# Patient Record
Sex: Male | Born: 1990
Health system: Southern US, Community
[De-identification: ages and names within clinical notes are randomized; demographics above are authoritative.]

## PROBLEM LIST (undated history)

## (undated) DIAGNOSIS — G809 Cerebral palsy, unspecified: Secondary | ICD-10-CM

## (undated) DIAGNOSIS — G800 Spastic quadriplegic cerebral palsy: Secondary | ICD-10-CM

## (undated) DIAGNOSIS — Z8619 Personal history of other infectious and parasitic diseases: Secondary | ICD-10-CM

## (undated) HISTORY — PX: OTHER SURGICAL HISTORY: SHX169

## (undated) HISTORY — DX: Spastic quadriplegic cerebral palsy: G80.0

## (undated) HISTORY — DX: Personal history of other infectious and parasitic diseases: Z86.19

## (undated) HISTORY — DX: Cerebral palsy, unspecified: G80.9

---
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
## 2010-05-12 ENCOUNTER — Encounter: Payer: Self-pay | Admitting: Pediatrics

## 2010-05-14 ENCOUNTER — Encounter: Payer: Self-pay | Admitting: Pediatrics

## 2011-09-15 DIAGNOSIS — Z131 Encounter for screening for diabetes mellitus: Secondary | ICD-10-CM | POA: Diagnosis not present

## 2011-09-15 DIAGNOSIS — Z13220 Encounter for screening for lipoid disorders: Secondary | ICD-10-CM | POA: Diagnosis not present

## 2011-09-15 DIAGNOSIS — G809 Cerebral palsy, unspecified: Secondary | ICD-10-CM | POA: Diagnosis not present

## 2011-10-18 DIAGNOSIS — Z131 Encounter for screening for diabetes mellitus: Secondary | ICD-10-CM | POA: Diagnosis not present

## 2011-10-18 DIAGNOSIS — Z13220 Encounter for screening for lipoid disorders: Secondary | ICD-10-CM | POA: Diagnosis not present

## 2012-07-08 DIAGNOSIS — G808 Other cerebral palsy: Secondary | ICD-10-CM | POA: Diagnosis not present

## 2012-07-08 DIAGNOSIS — M542 Cervicalgia: Secondary | ICD-10-CM | POA: Diagnosis not present

## 2012-08-29 DIAGNOSIS — G808 Other cerebral palsy: Secondary | ICD-10-CM | POA: Diagnosis not present

## 2012-10-02 DIAGNOSIS — G809 Cerebral palsy, unspecified: Secondary | ICD-10-CM | POA: Diagnosis not present

## 2012-10-02 DIAGNOSIS — Z131 Encounter for screening for diabetes mellitus: Secondary | ICD-10-CM | POA: Diagnosis not present

## 2012-10-02 DIAGNOSIS — R112 Nausea with vomiting, unspecified: Secondary | ICD-10-CM | POA: Diagnosis not present

## 2013-11-27 DIAGNOSIS — F70 Mild intellectual disabilities: Secondary | ICD-10-CM | POA: Diagnosis not present

## 2013-11-28 DIAGNOSIS — R112 Nausea with vomiting, unspecified: Secondary | ICD-10-CM | POA: Diagnosis not present

## 2013-11-28 DIAGNOSIS — Z131 Encounter for screening for diabetes mellitus: Secondary | ICD-10-CM | POA: Diagnosis not present

## 2013-11-28 DIAGNOSIS — Z0000 Encounter for general adult medical examination without abnormal findings: Secondary | ICD-10-CM | POA: Diagnosis not present

## 2013-12-10 ENCOUNTER — Encounter: Payer: Self-pay | Admitting: Family Medicine

## 2013-12-10 DIAGNOSIS — G8250 Quadriplegia, unspecified: Secondary | ICD-10-CM | POA: Diagnosis not present

## 2013-12-10 DIAGNOSIS — G809 Cerebral palsy, unspecified: Secondary | ICD-10-CM | POA: Diagnosis not present

## 2013-12-12 ENCOUNTER — Encounter: Payer: Self-pay | Admitting: Family Medicine

## 2014-02-03 DIAGNOSIS — G8250 Quadriplegia, unspecified: Secondary | ICD-10-CM | POA: Diagnosis not present

## 2014-02-03 DIAGNOSIS — G808 Other cerebral palsy: Secondary | ICD-10-CM | POA: Diagnosis not present

## 2014-02-03 DIAGNOSIS — L0501 Pilonidal cyst with abscess: Secondary | ICD-10-CM | POA: Diagnosis not present

## 2014-03-25 DIAGNOSIS — H6690 Otitis media, unspecified, unspecified ear: Secondary | ICD-10-CM | POA: Diagnosis not present

## 2014-03-25 DIAGNOSIS — G8250 Quadriplegia, unspecified: Secondary | ICD-10-CM | POA: Diagnosis not present

## 2014-03-25 DIAGNOSIS — L0501 Pilonidal cyst with abscess: Secondary | ICD-10-CM | POA: Diagnosis not present

## 2016-03-10 ENCOUNTER — Telehealth: Payer: Self-pay

## 2016-03-10 NOTE — Telephone Encounter (Signed)
Patient mom Levada Dy called yesterday 03/09/2016 requesting a handicap place card be completed for patient. Levada Dy states their old place card expires the end of this month (02/2016). Place card application completed by Fenton Malling PA, and picked up by patient step Dad on 03/10/2016. I called patient mom on the phone while patient step dad was here, and verbally confirmed that it was ok to release form to him.

## 2016-07-24 DIAGNOSIS — H547 Unspecified visual loss: Secondary | ICD-10-CM | POA: Insufficient documentation

## 2016-07-24 DIAGNOSIS — G800 Spastic quadriplegic cerebral palsy: Secondary | ICD-10-CM | POA: Insufficient documentation

## 2016-07-24 DIAGNOSIS — G809 Cerebral palsy, unspecified: Secondary | ICD-10-CM | POA: Insufficient documentation

## 2016-07-24 DIAGNOSIS — Z7409 Other reduced mobility: Secondary | ICD-10-CM | POA: Insufficient documentation

## 2016-07-24 DIAGNOSIS — G8250 Quadriplegia, unspecified: Secondary | ICD-10-CM | POA: Insufficient documentation

## 2016-07-25 ENCOUNTER — Ambulatory Visit (INDEPENDENT_AMBULATORY_CARE_PROVIDER_SITE_OTHER): Payer: Medicaid Other | Admitting: Family Medicine

## 2016-07-25 ENCOUNTER — Encounter: Payer: Self-pay | Admitting: Family Medicine

## 2016-07-25 VITALS — BP 122/76 | HR 124 | Temp 97.5°F | Resp 16

## 2016-07-25 DIAGNOSIS — R000 Tachycardia, unspecified: Secondary | ICD-10-CM | POA: Diagnosis not present

## 2016-07-25 DIAGNOSIS — Z13220 Encounter for screening for lipoid disorders: Secondary | ICD-10-CM

## 2016-07-25 DIAGNOSIS — Z0000 Encounter for general adult medical examination without abnormal findings: Secondary | ICD-10-CM | POA: Diagnosis not present

## 2016-07-25 DIAGNOSIS — Z136 Encounter for screening for cardiovascular disorders: Secondary | ICD-10-CM | POA: Diagnosis not present

## 2016-07-25 DIAGNOSIS — G800 Spastic quadriplegic cerebral palsy: Secondary | ICD-10-CM | POA: Diagnosis not present

## 2016-07-25 DIAGNOSIS — G809 Cerebral palsy, unspecified: Secondary | ICD-10-CM

## 2016-07-25 NOTE — Progress Notes (Signed)
Patient: Gregory Villanueva, Male    DOB: 1991/02/19, 25 y.o.   MRN: [id number] Visit Date: 07/25/2016  Today's Provider: Lelon Huh, MD   Chief Complaint  Patient presents with  . Annual Exam  . Cerebral Palsy    follow up   Subjective:    Annual physical exam Gregory Villanueva is a 25 y.o. male who presents today for health maintenance and complete physical. He feels fairly well. He reports exercising regularly, getting physical therapy every other week. He reports he is sleeping fairly well.  ----------------------------------------------------------------- Follow up Cerebral Palsy:  Patient was last seen over 2 years ago and no changes were made.   Follow up Spastic Quadriplegia:  Patient was last seen for this problem over 2 years ago and no changes were made.  Review of Systems  Constitutional: Negative for appetite change, chills, fatigue and fever.  HENT: Negative for congestion, ear pain, hearing loss, nosebleeds and trouble swallowing.   Eyes: Negative for pain and visual disturbance.  Respiratory: Negative for cough, chest tightness and shortness of breath.   Cardiovascular: Negative for chest pain, palpitations and leg swelling.  Gastrointestinal: Negative for abdominal pain, blood in stool, constipation, diarrhea, nausea and vomiting.  Endocrine: Negative for polydipsia, polyphagia and polyuria.  Genitourinary: Negative for dysuria and flank pain.  Musculoskeletal: Negative for arthralgias, back pain, joint swelling, myalgias and neck stiffness.  Skin: Negative for color change, rash and wound.  Neurological: Negative for dizziness, tremors, seizures, speech difficulty, weakness, light-headedness and headaches.  Psychiatric/Behavioral: Negative for behavioral problems, confusion, decreased concentration, dysphoric mood and sleep disturbance. The patient is not nervous/anxious.   All other systems reviewed and are negative.   Social History      He  reports  that he has never smoked. He does not have any smokeless tobacco history on file. He reports that he does not drink alcohol or use drugs.       Social History   Social History  . Marital status: Single    Spouse name: N/A  . Number of children: N/A  . Years of education: N/A   Social History Main Topics  . Smoking status: Never Smoker  . Smokeless tobacco: Not on file  . Alcohol use No  . Drug use: No  . Sexual activity: Not on file   Other Topics Concern  . Not on file   Social History Narrative  . No narrative on file    Past Medical History:  Diagnosis Date  . Cerebral palsy ([place])   . CP (cerebral palsy), spastic, quadriplegic ([place])   . History of chickenpox      Patient Active Problem List   Diagnosis Date Noted  . Cerebral palsy ([place]) 07/24/2016  . Spastic quadriplegic cerebral palsy ([place]) 07/24/2016  . Impaired mobility 07/24/2016  . Spastic quadriplegia ([place]) 07/24/2016  . Impaired vision 07/24/2016    Past Surgical History:  Procedure Laterality Date  . Dorsal Rhinoplasty  1998  . Various muscle and Tendon Surgeries     1992-2000    Family History        Family Status  Relation Status  . Mother Alive  . Father Deceased at age 77   cause of death: Pancreatic cancer  . Sister Alive  . Brother Alive  . Maternal Grandmother Alive  . Maternal Grandfather Alive  . Sister Alive  . Sister Alive        His family history includes Hypertension in his maternal  grandfather and maternal grandmother; Pancreatic cancer in his father.     Allergies no known allergies  No current outpatient prescriptions on file.   Patient Care Team: Birdie Sons, MD as PCP - General (Family Medicine)      Objective:   Vitals: BP 122/76 (BP Location: Left Arm, Patient Position: Sitting, Cuff Size: Normal)   Pulse (!) 124   Temp 97.5 F (36.4 C) (Oral)   Resp 16   SpO2 97% Comment: room air   Physical Exam   General Appearance:    Alert, cooperative, no  distress, appears stated age  Head:    Normocephalic, without obvious abnormality, atraumatic  Eyes:    PERRL, conjunctiva/corneas clear, EOM's intact, fundi    benign, both eyes       Ears:    Normal TM's and external ear canals, both ears  Nose:   Nares normal, septum midline, mucosa normal, no drainage   or sinus tenderness  Throat:   Lips, mucosa, and tongue normal; teeth and gums normal  Neck:   Supple, symmetrical, trachea midline, no adenopathy;       thyroid:  No enlargement/tenderness/nodules; no carotid   bruit or JVD  Back:     Symmetric, no curvature, ROM normal, no CVA tenderness  Lungs:     Clear to auscultation bilaterally, respirations unlabored  Chest wall:    No tenderness or deformity  Heart:    Tachycardic, regular rhythm, S1 and S2 normal, no murmur, rub   or gallop  Abdomen:     Soft, non-tender, bowel sounds active all four quadrants,    no masses, no organomegaly  Genitalia:    deferred  Rectal:    deferred  Extremities:   Extremities atraumatic, no cyanosis or edema  Pulses:   2+ and symmetric all extremities  Skin:   Skin color, texture, turgor normal, no rashes or lesions  Lymph nodes:   Cervical, supraclavicular, and axillary nodes normal  Neurologic:   CNII-XII intact. Note : awake and alert, oriented x 3. communicates fairly well. generally hypertonic musculature of all extremities     Depression Screen PHQ 2/9 Scores 07/25/2016  PHQ - 2 Score 0  PHQ- 9 Score 0      Assessment & Plan:     Routine Health Maintenance and Physical Exam  Exercise Activities and Dietary recommendations Goals    None      Immunization History  Administered Date(s) Administered  . Tdap 09/15/2011    Health Maintenance  Topic Date Due  . HIV Screening  10/24/2004  . INFLUENZA VACCINE  03/14/2016  . TETANUS/TDAP  09/14/2021     Discussed health benefits of physical activity, and encouraged him to engage in regular exercise appropriate for his age and  condition.    --------------------------------------------------------------------  1. Annual physical exam  - Lipid panel  2. Tachycardia  - EKG 12-Lead - Comprehensive metabolic panel - CBC - T4 AND TSH  3. Lipid screening  - Lipid panel  4. Cerebral palsy, unspecified type ([place]) Doing well. Continue current plan of care.   5. Spastic quadriplegic cerebral palsy ([place])  The entirety of the information documented in the History of Present Illness, Review of Systems and Physical Exam were personally obtained by me. Portions of this information were initially documented by Meyer Cory, CMA and reviewed by me for thoroughness and accuracy.    Lelon Huh, MD  [facility]

## 2016-07-26 LAB — CBC
HEMATOCRIT: 42 % (ref 37.5–51.0)
HEMOGLOBIN: 14.8 g/dL (ref 13.0–17.7)
MCH: 30 pg (ref 26.6–33.0)
MCHC: 35.2 g/dL (ref 31.5–35.7)
MCV: 85 fL (ref 79–97)
PLATELETS: 362 10*3/uL (ref 150–379)
RBC: 4.93 x10E6/uL (ref 4.14–5.80)
RDW: 13.4 % (ref 12.3–15.4)
WBC: 6.8 10*3/uL (ref 3.4–10.8)

## 2016-07-26 LAB — COMPREHENSIVE METABOLIC PANEL
ALK PHOS: 81 IU/L (ref 39–117)
ALT: 11 IU/L (ref 0–44)
AST: 13 IU/L (ref 0–40)
Albumin/Globulin Ratio: 2 (ref 1.2–2.2)
Albumin: 4.7 g/dL (ref 3.5–5.5)
BUN/Creatinine Ratio: 14 (ref 9–20)
BUN: 10 mg/dL (ref 6–20)
Bilirubin Total: 0.3 mg/dL (ref 0.0–1.2)
CALCIUM: 9.6 mg/dL (ref 8.7–10.2)
CO2: 20 mmol/L (ref 18–29)
CREATININE: 0.71 mg/dL — AB (ref 0.76–1.27)
Chloride: 101 mmol/L (ref 96–106)
GFR calc Af Amer: 150 mL/min/{1.73_m2} (ref >59)
GFR, EST NON AFRICAN AMERICAN: 129 mL/min/{1.73_m2} (ref >59)
GLOBULIN, TOTAL: 2.4 g/dL (ref 1.5–4.5)
GLUCOSE: 103 mg/dL — AB (ref 65–99)
Potassium: 4.4 mmol/L (ref 3.5–5.2)
SODIUM: 139 mmol/L (ref 134–144)
Total Protein: 7.1 g/dL (ref 6.0–8.5)

## 2016-07-26 LAB — T4 AND TSH
T4 TOTAL: 7.9 ug/dL (ref 4.5–12.0)
TSH: 1.49 u[IU]/mL (ref 0.450–4.500)

## 2016-07-26 LAB — LIPID PANEL
Chol/HDL Ratio: 3.9 ratio units (ref 0.0–5.0)
Cholesterol, Total: 150 mg/dL (ref 100–199)
HDL: 38 mg/dL — AB (ref >39)
LDL CALC: 85 mg/dL (ref 0–99)
Triglycerides: 135 mg/dL (ref 0–149)
VLDL CHOLESTEROL CAL: 27 mg/dL (ref 5–40)

## 2016-10-23 ENCOUNTER — Telehealth: Payer: Self-pay | Admitting: Family Medicine

## 2016-10-23 NOTE — Telephone Encounter (Signed)
Gregory Villanueva with [facility] is requesting a Rx or letter written stating due to pt having health issues/mild IDD, CP/quadriplegic and visual impairment pt will need 24 hour care.  Fax [phone].  CB#[phone]/MW

## 2017-09-21 ENCOUNTER — Telehealth: Payer: Self-pay | Admitting: *Deleted

## 2017-09-21 NOTE — Telephone Encounter (Signed)
Levada Dy needs confirmation statement of necessity forms for equipment were received 2 days ago? She would like a cb [phone]

## 2017-12-27 ENCOUNTER — Telehealth: Payer: Self-pay | Admitting: Family Medicine

## 2017-12-27 NOTE — Telephone Encounter (Signed)
Gregory Villanueva with [facility] called wanting to know if we have recd and filled out a request that he faxed a couple weeks ago for [employer] to get batteries and arm rest for his wheelchair .  He needs this asap because he cannot operate the wheelchair.  Gregory Villanueva's call back is [phone]  Fax number is on the form  Gregory Villanueva is going to refax the form to be on the safe side but needs this asap  Thanks teri

## 2017-12-27 NOTE — Telephone Encounter (Signed)
Received the order form from [facility]  Gregory Villanueva) for pt's wheelchair (replacement batteries, left armrest assembly). Placed form in providers box. Thanks CC

## 2017-12-27 NOTE — Telephone Encounter (Signed)
Please advise 

## 2017-12-28 NOTE — Telephone Encounter (Signed)
i've sign several things for him the last few weeks, but I don't remember if one was batteries. They should resend order if they haven't gotten them yet.

## 2018-01-03 NOTE — Telephone Encounter (Signed)
Gregory Villanueva stated that he received all signed orders from Dr. Caryn Section. He has placed order for parts to wheelchair and is waiting for them to come in. He doesn't need anything else from [place].

## 2018-02-27 ENCOUNTER — Telehealth: Payer: Self-pay | Admitting: Family Medicine

## 2018-02-27 NOTE — Telephone Encounter (Signed)
Please advise 

## 2018-02-27 NOTE — Telephone Encounter (Signed)
Order written, please fax

## 2018-02-27 NOTE — Telephone Encounter (Signed)
Amy's physical therapist needs an RX for aquatic physical therapy. Call mom when ready.

## 2018-02-28 NOTE — Telephone Encounter (Signed)
LMOVM for return call.  

## 2018-03-07 NOTE — Telephone Encounter (Signed)
Left detailed message for pt's mom letting her know order is ready to pick-up.

## 2018-04-16 ENCOUNTER — Emergency Department: Payer: Medicare Other

## 2018-04-16 ENCOUNTER — Encounter: Payer: Self-pay | Admitting: Emergency Medicine

## 2018-04-16 ENCOUNTER — Other Ambulatory Visit: Payer: Self-pay

## 2018-04-16 ENCOUNTER — Encounter
Admission: EM | Disposition: A | Discharge status: Home / Self Care | Payer: Self-pay | Source: Home / Self Care | Attending: Internal Medicine

## 2018-04-16 ENCOUNTER — Encounter: Payer: Self-pay | Admitting: Anesthesiology

## 2018-04-16 ENCOUNTER — Inpatient Hospital Stay
Admission: EM | Admit: 2018-04-16 | Discharge: 2018-04-17 | DRG: 391 | Disposition: A | Discharge status: Home / Self Care | Payer: Medicare Other | Attending: Internal Medicine | Admitting: Internal Medicine

## 2018-04-16 DIAGNOSIS — T18128A Food in esophagus causing other injury, initial encounter: Secondary | ICD-10-CM

## 2018-04-16 DIAGNOSIS — Z993 Dependence on wheelchair: Secondary | ICD-10-CM

## 2018-04-16 DIAGNOSIS — K200 Eosinophilic esophagitis: Secondary | ICD-10-CM | POA: Diagnosis not present

## 2018-04-16 DIAGNOSIS — T18108A Unspecified foreign body in esophagus causing other injury, initial encounter: Secondary | ICD-10-CM

## 2018-04-16 DIAGNOSIS — K222 Esophageal obstruction: Secondary | ICD-10-CM

## 2018-04-16 DIAGNOSIS — Z800 Family history of malignant neoplasm of digestive organs: Secondary | ICD-10-CM

## 2018-04-16 DIAGNOSIS — R000 Tachycardia, unspecified: Secondary | ICD-10-CM | POA: Diagnosis present

## 2018-04-16 DIAGNOSIS — R1314 Dysphagia, pharyngoesophageal phase: Secondary | ICD-10-CM | POA: Diagnosis not present

## 2018-04-16 DIAGNOSIS — G800 Spastic quadriplegic cerebral palsy: Secondary | ICD-10-CM | POA: Diagnosis present

## 2018-04-16 DIAGNOSIS — D72829 Elevated white blood cell count, unspecified: Secondary | ICD-10-CM | POA: Diagnosis not present

## 2018-04-16 DIAGNOSIS — R1310 Dysphagia, unspecified: Secondary | ICD-10-CM | POA: Diagnosis not present

## 2018-04-16 DIAGNOSIS — T18198A Other foreign object in esophagus causing other injury, initial encounter: Secondary | ICD-10-CM | POA: Diagnosis not present

## 2018-04-16 LAB — COMPREHENSIVE METABOLIC PANEL
ALK PHOS: 73 U/L (ref 38–126)
ALT: 21 U/L (ref 0–44)
AST: 25 U/L (ref 15–41)
Albumin: 4.6 g/dL (ref 3.5–5.0)
Anion gap: 13 (ref 5–15)
BILIRUBIN TOTAL: 0.8 mg/dL (ref 0.3–1.2)
BUN: 14 mg/dL (ref 6–20)
CALCIUM: 9.4 mg/dL (ref 8.9–10.3)
CHLORIDE: 106 mmol/L (ref 98–111)
CO2: 22 mmol/L (ref 22–32)
CREATININE: 0.85 mg/dL (ref 0.61–1.24)
Glucose, Bld: 106 mg/dL — ABNORMAL HIGH (ref 70–99)
Potassium: 3.7 mmol/L (ref 3.5–5.1)
SODIUM: 141 mmol/L (ref 135–145)
Total Protein: 8 g/dL (ref 6.5–8.1)

## 2018-04-16 LAB — CBC WITH DIFFERENTIAL/PLATELET
BASOS ABS: 0 10*3/uL (ref 0–0.1)
Basophils Relative: 0 %
EOS ABS: 0.2 10*3/uL (ref 0–0.7)
EOS PCT: 2 %
HCT: 43.9 % (ref 40.0–52.0)
Hemoglobin: 15.1 g/dL (ref 13.0–18.0)
Lymphocytes Relative: 10 %
Lymphs Abs: 1.5 10*3/uL (ref 1.0–3.6)
MCH: 30.1 pg (ref 26.0–34.0)
MCHC: 34.4 g/dL (ref 32.0–36.0)
MCV: 87.4 fL (ref 80.0–100.0)
MONO ABS: 0.9 10*3/uL (ref 0.2–1.0)
Monocytes Relative: 6 %
Neutro Abs: 12.5 10*3/uL — ABNORMAL HIGH (ref 1.4–6.5)
Neutrophils Relative %: 82 %
PLATELETS: 299 10*3/uL (ref 150–440)
RBC: 5.02 MIL/uL (ref 4.40–5.90)
RDW: 13.5 % (ref 11.5–14.5)
WBC: 15.2 10*3/uL — AB (ref 3.8–10.6)

## 2018-04-16 SURGERY — EGD (ESOPHAGOGASTRODUODENOSCOPY)
Anesthesia: Choice

## 2018-04-16 MED ORDER — SODIUM CHLORIDE 0.9 % IV BOLUS
500.0000 mL | Freq: Once | INTRAVENOUS | Status: AC
  Administered 2018-04-16: 500 mL via INTRAVENOUS

## 2018-04-16 MED ORDER — ONDANSETRON HCL 4 MG/2ML IJ SOLN
4.0000 mg | Freq: Once | INTRAMUSCULAR | Status: AC
  Administered 2018-04-16: 4 mg via INTRAVENOUS
  Filled 2018-04-16: qty 2

## 2018-04-16 MED ORDER — FAMOTIDINE 40 MG/5ML PO SUSR
40.0000 mg | Freq: Every day | ORAL | Status: DC
  Administered 2018-04-16: 40 mg via ORAL
  Filled 2018-04-16 (×2): qty 5

## 2018-04-16 MED ORDER — GLUCAGON HCL RDNA (DIAGNOSTIC) 1 MG IJ SOLR
1.0000 mg | Freq: Once | INTRAMUSCULAR | Status: AC
  Administered 2018-04-16: 1 mg via INTRAVENOUS
  Filled 2018-04-16: qty 1

## 2018-04-16 MED ORDER — SODIUM CHLORIDE 0.9 % IV BOLUS: 500.0000 mL | Freq: Once | INTRAVENOUS | Status: DC

## 2018-04-16 MED ORDER — IOHEXOL 350 MG/ML SOLN
75.0000 mL | Freq: Once | INTRAVENOUS | Status: AC | PRN
  Administered 2018-04-16: 75 mL via INTRAVENOUS

## 2018-04-16 MED ORDER — LORAZEPAM 2 MG/ML IJ SOLN
1.0000 mg | Freq: Once | INTRAMUSCULAR | Status: AC
  Administered 2018-04-16: 1 mg via INTRAVENOUS
  Filled 2018-04-16: qty 1

## 2018-04-16 NOTE — ED Notes (Signed)
Called pharmacy about medication, stated that it would be sent down shortly.

## 2018-04-16 NOTE — ED Notes (Signed)
Pt is back from the OR, procedure was cancelled due to patient being able to tolerate PO fluids per physician.

## 2018-04-16 NOTE — Anesthesia Preprocedure Evaluation (Deleted)
Anesthesia Evaluation  Patient identified by MRN, date of birth, ID band Patient awake    Reviewed: Allergy & Precautions, H&P , NPO status , Patient's Chart, lab work & pertinent test results, reviewed documented beta blocker date and time   Airway Mallampati: II  TM Distance: >3 FB Neck ROM: full    Dental  (+) Teeth Intact   Pulmonary neg pulmonary ROS,    Pulmonary exam normal        Cardiovascular negative cardio ROS Normal cardiovascular exam Rhythm:regular Rate:Normal     Neuro/Psych Hx of CP with Spastic quadriplegia sp ? Aspiration this evening.   Residual Symptoms negative neurological ROS  negative psych ROS   GI/Hepatic negative GI ROS, Neg liver ROS,   Endo/Other  negative endocrine ROS  Renal/GU negative Renal ROS  negative genitourinary   Musculoskeletal   Abdominal   Peds  Hematology negative hematology ROS (+)   Anesthesia Other Findings Past Medical History: No date: Cerebral palsy (H[facility]) No date: CP (cerebral palsy), spastic, quadriplegic ([place]) No date: History of chickenpox Past Surgical History: 1998: Dorsal Rhinoplasty No date: Various muscle and Tendon Surgeries     Comment:  1992-2000 BMI    Body Mass Index:  25.22 kg/m     Reproductive/Obstetrics negative OB ROS                            Anesthesia Physical Anesthesia Plan  ASA: III and emergent  Anesthesia Plan: General ETT   Post-op Pain Management:    Induction:   PONV Risk Score and Plan:   Airway Management Planned:   Additional Equipment:   Intra-op Plan:   Post-operative Plan:   Informed Consent: I have reviewed the patients History and Physical, chart, labs and discussed the procedure including the risks, benefits and alternatives for the proposed anesthesia with the patient or authorized representative who has indicated his/her understanding and acceptance.   Dental Advisory  Given  Plan Discussed with: CRNA  Anesthesia Plan Comments:        Anesthesia Quick Evaluation

## 2018-04-16 NOTE — ED Triage Notes (Signed)
Pt arrived to the ED via EMS from home accompanied by his mother for complaints of having a foreign body obstructing his "troat." Pt is AOx4 in no apparent distress, having difficulty swallowing.

## 2018-04-16 NOTE — ED Notes (Signed)
Dr. Quentin Cornwall stated to go ahead and given 2nd 500 bolus of NS. It is started at this time.

## 2018-04-16 NOTE — ED Provider Notes (Addendum)
[facility] Emergency Department Provider Note    First MD Initiated Contact with Patient 04/16/18 1942     (approximate)  I have reviewed the triage vital signs and the nursing notes.   HISTORY  Chief Complaint Foreign Body    HPI Gregory Villanueva is a 27 y.o. male with cerebral palsy presents the ER after choking episode.  Patient was visiting brought in [place] game and was eating a [employer] sandwich when he choked on partially chewed chicken.  Mother is a volume paramedic was immediately present there is no cyanosis.  She did provide hemorrhoid over twice because he was choking and was having difficulty coughing up any of the food.  He did spit up to large unchewed pieces of chicken but is still having significant discomfort and difficulty swallowing.  After trying to get him to tolerate soda or water at the game he is unable to tolerate any swallowing and they brought him to the ER for further evaluation.  He denies any shortness of breath but points to his suprasternal notch feel that something is stuck in his throat.    Past Medical History:  Diagnosis Date  . Cerebral palsy ([place])   . CP (cerebral palsy), spastic, quadriplegic ([place])   . History of chickenpox    Family History  Problem Relation Age of Onset  . Pancreatic cancer Father 65  . Hypertension Maternal Grandmother   . Hypertension Maternal Grandfather   . Prostate cancer Neg Hx   . Colon cancer Neg Hx    Past Surgical History:  Procedure Laterality Date  . Dorsal Rhinoplasty  1998  . ESOPHAGOGASTRODUODENOSCOPY (EGD) WITH PROPOFOL N/A 04/17/2018   Procedure: ESOPHAGOGASTRODUODENOSCOPY (EGD) WITH PROPOFOL;  Surgeon: Lin Landsman, MD;  Location: [facility];  Service: Gastroenterology;  Laterality: N/A;  . Various muscle and Tendon Surgeries     1992-2000   Patient Active Problem List   Diagnosis Date Noted  . Dysphagia 04/17/2018  . Eosinophilic esophagitis   . Cerebral  palsy ([place]) 07/24/2016  . Spastic quadriplegic cerebral palsy ([place]) 07/24/2016  . Impaired mobility 07/24/2016  . Spastic quadriplegia ([place]) 07/24/2016  . Impaired vision 07/24/2016      Prior to Admission medications   Medication Sig Start Date End Date Taking? Authorizing Provider  omeprazole (PRILOSEC) 40 MG capsule Take 1 capsule (40 mg total) by mouth 2 (two) times daily. 04/17/18 05/17/18  Bettey Costa, MD    Allergies Patient has no known allergies.    Social History Social History   Tobacco Use  . Smoking status: Never Smoker  . Smokeless tobacco: Never Used  Substance Use Topics  . Alcohol use: No  . Drug use: No    Review of Systems Patient denies headaches, rhinorrhea, blurry vision, numbness, shortness of breath, chest pain, edema, cough, abdominal pain, nausea, vomiting, diarrhea, dysuria, fevers, rashes or hallucinations unless otherwise stated above in HPI. ____________________________________________   PHYSICAL EXAM:  VITAL SIGNS: Vitals:   04/17/18 1210 04/17/18 1244  BP: 128/78 125/71  Pulse: (!) 116 97  Resp: 16   Temp:    SpO2: 98% 99%    Constitutional: Alert and oriented.  Eyes: Conjunctivae are normal.  Head: Atraumatic. Nose: No congestion/rhinnorhea. Mouth/Throat: Mucous membranes are moist.  No posterior pharyngeal swelling or abnormality Neck: No stridor. Painless ROM.  Cardiovascular: Normal rate, regular rhythm. Grossly normal heart sounds.  Good peripheral circulation. Respiratory: Normal respiratory effort.  No retractions. Lungs CTAB. Gastrointestinal: Soft and nontender. No distention.  No abdominal bruits. No CVA tenderness. Genitourinary: deferred Musculoskeletal: No lower extremity tenderness nor edema.  No joint effusions. Neurologic: No gross focal neurologic deficits are appreciated. No facial droop Skin:  Skin is warm, dry and intact. No rash noted. Psychiatric: Mood and affect are normal. Speech and behavior are  normal.  ____________________________________________   LABS (all labs ordered are listed, but only abnormal results are displayed)  No results found for this or any previous visit (from the past 24 hour(s)). ____________________________________________ __________ ED ECG REPORT I, Merlyn Lot, the attending physician, personally viewed and interpreted this ECG.   Date: 04/16/2018  EKG Time: 23:10  Rate: 125  Rhythm: sinus tachy  Axis: normal  Intervals:normal intervals  ST&T Change: no stemi  __________________________________  RADIOLOGY  I personally reviewed all radiographic images ordered to evaluate for the above acute complaints and reviewed radiology reports and findings.  These findings were personally discussed with the patient.  Please see medical record for radiology report.  ____________________________________________   PROCEDURES  Procedure(s) performed:  Procedures    Critical Care performed: no ____________________________________________   INITIAL IMPRESSION / ASSESSMENT AND PLAN / ED COURSE  Pertinent labs & imaging results that were available during my care of the patient were reviewed by me and considered in my medical decision making (see chart for details).   DDX: food bolus impaction,   Gregory Villanueva is a 27 y.o. who presents to the ED with symptoms as described above.  Protecting airway.  No hypoxia vital signs are stable.  Presentation seems concerning for food bolus.  Will order chest x-ray.  Will check blood work.  Clinical Course as of Apr 24 1516  Tue Apr 16, 2018  2022 Reassessed.  Still protecting his airway but still having some discomfort and sensation of food being stuck in his throat.  X-ray does not show any evidence of consolidation or aspiration he does not have any hypoxia without any stridor.  I am concerned for his impaction.  Will trial glucagon as well as IV Ativan.   [PR]  2046 Discussed case with Dr. Marius Ditch who  kindly agrees to come assess the patient.   [PR]  2231 Patient was taken up to the endoscopy suite and reportedly just upon them taking her mother he started being able to tolerate liquids.  Patient was evaluated by GI at that point he is tolerating liquids case was canceled.  Return to the ER stable and well-appearing in no acute distress.   [PR]  2307 Noted to be mildly tachycardic in the 120s.  No respiratory distress.  Based on his persistent tachycardia will order CT imaging to evaluate for any evidence of any stenosis aspiration and further evaluate.   [PR]  2316 Patient without any fever with persistent tachycardia.  It does show a sinus tachycardia no dysrhythmia.  Based on his leukocytosis possible aspiration event and tachycardia will order CT imaging to further evaluate and also exclude any evidence of mediastinitis.   [PR]    Clinical Course User Index [PR] Merlyn Lot, MD     As part of my medical decision making, I reviewed the following data within the [facility] notes reviewed and incorporated, Labs reviewed, notes from prior ED visits.   ____________________________________________   FINAL CLINICAL IMPRESSION(S) / ED DIAGNOSES  Final diagnoses:  Dysphagia, unspecified type  Esophageal obstruction due to food impaction  Esophageal foreign body, initial encounter      NEW MEDICATIONS STARTED DURING THIS VISIT:  Discharge Medication List as of 04/17/2018  1:08 PM    START taking these medications   Details  omeprazole (PRILOSEC) 40 MG capsule Take 1 capsule (40 mg total) by mouth 2 (two) times daily., Starting Wed 04/17/2018, Until Fri 05/17/2018, Normal         Note:  This document was prepared using Dragon voice recognition software and may include unintentional dictation errors.    Merlyn Lot, MD 04/17/18 9507    Merlyn Lot, MD 04/24/18 [phone]

## 2018-04-17 ENCOUNTER — Telehealth: Payer: Self-pay | Admitting: Family Medicine

## 2018-04-17 ENCOUNTER — Telehealth: Payer: Self-pay | Admitting: Gastroenterology

## 2018-04-17 ENCOUNTER — Inpatient Hospital Stay: Payer: Medicare Other | Admitting: Registered Nurse

## 2018-04-17 ENCOUNTER — Other Ambulatory Visit: Payer: Self-pay

## 2018-04-17 ENCOUNTER — Encounter: Payer: Self-pay | Admitting: *Deleted

## 2018-04-17 ENCOUNTER — Encounter
Admission: EM | Disposition: A | Discharge status: Home / Self Care | Payer: Self-pay | Source: Home / Self Care | Attending: Internal Medicine

## 2018-04-17 DIAGNOSIS — T18128A Food in esophagus causing other injury, initial encounter: Secondary | ICD-10-CM | POA: Diagnosis not present

## 2018-04-17 DIAGNOSIS — G800 Spastic quadriplegic cerebral palsy: Secondary | ICD-10-CM | POA: Diagnosis present

## 2018-04-17 DIAGNOSIS — T189XXA Foreign body of alimentary tract, part unspecified, initial encounter: Secondary | ICD-10-CM | POA: Diagnosis not present

## 2018-04-17 DIAGNOSIS — K200 Eosinophilic esophagitis: Secondary | ICD-10-CM

## 2018-04-17 DIAGNOSIS — R000 Tachycardia, unspecified: Secondary | ICD-10-CM | POA: Diagnosis present

## 2018-04-17 DIAGNOSIS — D72829 Elevated white blood cell count, unspecified: Secondary | ICD-10-CM | POA: Diagnosis present

## 2018-04-17 DIAGNOSIS — Z800 Family history of malignant neoplasm of digestive organs: Secondary | ICD-10-CM | POA: Diagnosis not present

## 2018-04-17 DIAGNOSIS — G809 Cerebral palsy, unspecified: Secondary | ICD-10-CM | POA: Diagnosis not present

## 2018-04-17 DIAGNOSIS — T18108A Unspecified foreign body in esophagus causing other injury, initial encounter: Secondary | ICD-10-CM | POA: Diagnosis not present

## 2018-04-17 DIAGNOSIS — T18198A Other foreign object in esophagus causing other injury, initial encounter: Secondary | ICD-10-CM | POA: Diagnosis not present

## 2018-04-17 DIAGNOSIS — Z993 Dependence on wheelchair: Secondary | ICD-10-CM | POA: Diagnosis not present

## 2018-04-17 DIAGNOSIS — R1310 Dysphagia, unspecified: Secondary | ICD-10-CM | POA: Diagnosis not present

## 2018-04-17 DIAGNOSIS — K222 Esophageal obstruction: Secondary | ICD-10-CM

## 2018-04-17 DIAGNOSIS — R1314 Dysphagia, pharyngoesophageal phase: Secondary | ICD-10-CM | POA: Diagnosis present

## 2018-04-17 HISTORY — PX: ESOPHAGOGASTRODUODENOSCOPY (EGD) WITH PROPOFOL: SHX5813

## 2018-04-17 LAB — BASIC METABOLIC PANEL
Anion gap: 10 (ref 5–15)
BUN: 11 mg/dL (ref 6–20)
CO2: 23 mmol/L (ref 22–32)
CREATININE: 0.71 mg/dL (ref 0.61–1.24)
Calcium: 8.5 mg/dL — ABNORMAL LOW (ref 8.9–10.3)
Chloride: 108 mmol/L (ref 98–111)
GFR calc Af Amer: >60 mL/min (ref >60)
GFR calc non Af Amer: >60 mL/min (ref >60)
GLUCOSE: 82 mg/dL (ref 70–99)
POTASSIUM: 3.9 mmol/L (ref 3.5–5.1)
SODIUM: 141 mmol/L (ref 135–145)

## 2018-04-17 LAB — CBC
HCT: 42.4 % (ref 40.0–52.0)
Hemoglobin: 14.4 g/dL (ref 13.0–18.0)
MCH: 30.3 pg (ref 26.0–34.0)
MCHC: 33.9 g/dL (ref 32.0–36.0)
MCV: 89.4 fL (ref 80.0–100.0)
PLATELETS: 246 10*3/uL (ref 150–440)
RBC: 4.74 MIL/uL (ref 4.40–5.90)
RDW: 13.3 % (ref 11.5–14.5)
WBC: 9 10*3/uL (ref 3.8–10.6)

## 2018-04-17 SURGERY — ESOPHAGOGASTRODUODENOSCOPY (EGD) WITH PROPOFOL
Anesthesia: General

## 2018-04-17 MED ORDER — SODIUM CHLORIDE 0.9 % IV SOLN
INTRAVENOUS | Status: DC
  Administered 2018-04-17: 04:00:00 via INTRAVENOUS

## 2018-04-17 MED ORDER — HEPARIN SODIUM (PORCINE) 5000 UNIT/ML IJ SOLN: 5000.0000 [IU] | Freq: Three times a day (TID) | INTRAMUSCULAR | Status: DC

## 2018-04-17 MED ORDER — FENTANYL CITRATE (PF) 100 MCG/2ML IJ SOLN: 25.0000 ug | INTRAMUSCULAR | Status: DC | PRN

## 2018-04-17 MED ORDER — DOCUSATE SODIUM 100 MG PO CAPS: 100.0000 mg | ORAL_CAPSULE | Freq: Two times a day (BID) | ORAL | Status: DC

## 2018-04-17 MED ORDER — MIDAZOLAM HCL 2 MG/2ML IJ SOLN
INTRAMUSCULAR | Status: DC | PRN
  Administered 2018-04-17: 1 mg via INTRAVENOUS

## 2018-04-17 MED ORDER — SODIUM CHLORIDE 0.9 % IV SOLN
3.0000 g | Freq: Four times a day (QID) | INTRAVENOUS | Status: DC
  Administered 2018-04-17 (×2): 3 g via INTRAVENOUS
  Filled 2018-04-17 (×4): qty 3

## 2018-04-17 MED ORDER — ONDANSETRON HCL 4 MG/2ML IJ SOLN: 4.0000 mg | Freq: Once | INTRAMUSCULAR | Status: DC | PRN

## 2018-04-17 MED ORDER — ONDANSETRON HCL 4 MG PO TABS: 4.0000 mg | ORAL_TABLET | Freq: Four times a day (QID) | ORAL | Status: DC | PRN

## 2018-04-17 MED ORDER — MIDAZOLAM HCL 2 MG/2ML IJ SOLN
INTRAMUSCULAR | Status: AC
  Filled 2018-04-17: qty 2

## 2018-04-17 MED ORDER — ONDANSETRON HCL 4 MG/2ML IJ SOLN
INTRAMUSCULAR | Status: DC | PRN
  Administered 2018-04-17: 4 mg via INTRAVENOUS

## 2018-04-17 MED ORDER — SODIUM CHLORIDE 0.9 % IV SOLN
INTRAVENOUS | Status: DC
  Administered 2018-04-17: 11:00:00 via INTRAVENOUS

## 2018-04-17 MED ORDER — GLUCAGON HCL RDNA (DIAGNOSTIC) 1 MG IJ SOLR
INTRAMUSCULAR | Status: AC
  Filled 2018-04-17: qty 1

## 2018-04-17 MED ORDER — GLUCAGON HCL RDNA (DIAGNOSTIC) 1 MG IJ SOLR
1.0000 mg | Freq: Once | INTRAMUSCULAR | Status: AC
  Administered 2018-04-17: 1 mg via INTRAVENOUS

## 2018-04-17 MED ORDER — ACETAMINOPHEN 650 MG RE SUPP: 650.0000 mg | Freq: Four times a day (QID) | RECTAL | Status: DC | PRN

## 2018-04-17 MED ORDER — ONDANSETRON HCL 4 MG/2ML IJ SOLN
INTRAMUSCULAR | Status: AC
  Filled 2018-04-17: qty 2

## 2018-04-17 MED ORDER — GLYCOPYRROLATE 0.2 MG/ML IJ SOLN
INTRAMUSCULAR | Status: AC
  Filled 2018-04-17: qty 1

## 2018-04-17 MED ORDER — ACETAMINOPHEN 325 MG PO TABS: 650.0000 mg | ORAL_TABLET | Freq: Four times a day (QID) | ORAL | Status: DC | PRN

## 2018-04-17 MED ORDER — SEVOFLURANE IN SOLN
RESPIRATORY_TRACT | Status: AC
  Filled 2018-04-17: qty 250

## 2018-04-17 MED ORDER — DEXAMETHASONE SODIUM PHOSPHATE 10 MG/ML IJ SOLN
INTRAMUSCULAR | Status: AC
  Filled 2018-04-17: qty 1

## 2018-04-17 MED ORDER — OMEPRAZOLE 40 MG PO CPDR: 40.0000 mg | DELAYED_RELEASE_CAPSULE | Freq: Two times a day (BID) | ORAL | 1 refills | Status: DC

## 2018-04-17 MED ORDER — BISACODYL 5 MG PO TBEC: 5.0000 mg | DELAYED_RELEASE_TABLET | Freq: Every day | ORAL | Status: DC | PRN

## 2018-04-17 MED ORDER — ONDANSETRON HCL 4 MG/2ML IJ SOLN: 4.0000 mg | Freq: Four times a day (QID) | INTRAMUSCULAR | Status: DC | PRN

## 2018-04-17 MED ORDER — LIDOCAINE HCL (CARDIAC) PF 100 MG/5ML IV SOSY
PREFILLED_SYRINGE | INTRAVENOUS | Status: DC | PRN
  Administered 2018-04-17: 40 mg via INTRAVENOUS

## 2018-04-17 MED ORDER — ROCURONIUM BROMIDE 50 MG/5ML IV SOLN
INTRAVENOUS | Status: AC
  Filled 2018-04-17: qty 1

## 2018-04-17 MED ORDER — HYDROCODONE-ACETAMINOPHEN 5-325 MG PO TABS: 1.0000 | ORAL_TABLET | ORAL | Status: DC | PRN

## 2018-04-17 MED ORDER — FENTANYL CITRATE (PF) 100 MCG/2ML IJ SOLN
INTRAMUSCULAR | Status: AC
  Filled 2018-04-17: qty 2

## 2018-04-17 MED ORDER — PROPOFOL 500 MG/50ML IV EMUL
INTRAVENOUS | Status: AC
  Filled 2018-04-17: qty 50

## 2018-04-17 MED ORDER — PROPOFOL 10 MG/ML IV BOLUS
INTRAVENOUS | Status: DC | PRN
  Administered 2018-04-17: 10 mg via INTRAVENOUS
  Administered 2018-04-17: 100 mg via INTRAVENOUS
  Administered 2018-04-17 (×4): 10 mg via INTRAVENOUS

## 2018-04-17 NOTE — Discharge Summary (Signed)
[facility] at [place] NAME: Gregory Villanueva    MR#:  [id number]  DATE OF BIRTH:  1991/07/17  DATE OF ADMISSION:  04/16/2018 ADMITTING PHYSICIAN: Amelia Jo, MD  DATE OF DISCHARGE: 04/17/2018  PRIMARY CARE PHYSICIAN: Birdie Sons, MD    ADMISSION DIAGNOSIS:  Esophageal foreign body, initial encounter [T18.108A] Esophageal obstruction due to food impaction [K22.2, T18.128A] Dysphagia, unspecified type [R13.10]  DISCHARGE DIAGNOSIS:  Active Problems:   Dysphagia   Eosinophilic esophagitis   SECONDARY DIAGNOSIS:   Past Medical History:  Diagnosis Date  . Cerebral palsy ([place])   . CP (cerebral palsy), spastic, quadriplegic ([place])   . History of chickenpox     HOSPITAL COURSE:   27 y/o male with CP presented to ED with foreign body stuck in espohagus.  1.Food bolus impactions: He underwent EGD which shows eosinophilic esophagitis. GI has recommended PPI BID and outpatient follow up.  2. Hx of CP wheelchair bound   DISCHARGE CONDITIONS AND DIET:  Stable Diet as tolerated  CONSULTS OBTAINED:  Treatment Team:  Lin Landsman, MD Virgel Manifold, MD  DRUG ALLERGIES:  No Known Allergies  DISCHARGE MEDICATIONS:   Allergies as of 04/17/2018   No Known Allergies     Medication List    TAKE these medications   omeprazole 40 MG capsule Commonly known as:  PRILOSEC Take 1 capsule (40 mg total) by mouth 2 (two) times daily.         Today   CHIEF COMPLAINT:  S/p egd   VITAL SIGNS:  Blood pressure 138/78, pulse (!) 129, temperature 98.2 F (36.8 C), temperature source Tympanic, resp. rate 20, height 5\' 9"  (1.753 m), weight 74.7 kg, SpO2 100 %.   REVIEW OF SYSTEMS:  Unable to obtain due to CP   PHYSICAL EXAMINATION:  GENERAL:  27 y.o.-year-old patient lying in the bed with no acute distress.  NECK:  Supple, no jugular venous distention. No thyroid enlargement, no tenderness.  LUNGS: Normal breath  sounds bilaterally, no wheezing, rales,rhonchi  No use of accessory muscles of respiration.  CARDIOVASCULAR: S1, S2 normal. No murmurs, rubs, or gallops.  ABDOMEN: Soft, non-tender, non-distended. Bowel sounds present. No organomegaly or mass.  EXTREMITIES: atrophy LE  PSYCHIATRIC: The patient is alert SKIN: No obvious rash, lesion, or ulcer.   DATA REVIEW:   CBC Recent Labs  Lab 04/17/18 0723  WBC 9.0  HGB 14.4  HCT 42.4  PLT 246    Chemistries  Recent Labs  Lab 04/16/18 2005 04/17/18 0723  NA 141 141  K 3.7 3.9  CL 106 108  CO2 22 23  GLUCOSE 106* 82  BUN 14 11  CREATININE 0.85 0.71  CALCIUM 9.4 8.5*  AST 25  --   ALT 21  --   ALKPHOS 73  --   BILITOT 0.8  --     Cardiac Enzymes No results for input(s): TROPONINI in the last 168 hours.  Microbiology Results  @[email or web address] @  RADIOLOGY:  Ct Angio Chest Pe W And/or Wo Contrast  Result Date: 04/17/2018 CLINICAL DATA:  Template of 4 body obstruction in his throat. Difficulty swallowing. EXAM: CT ANGIOGRAPHY CHEST WITH CONTRAST TECHNIQUE: Multidetector CT imaging of the chest was performed using the standard protocol during bolus administration of intravenous contrast. Multiplanar CT image reconstructions and MIPs were obtained to evaluate the vascular anatomy. CONTRAST:  76mL OMNIPAQUE IOHEXOL 350 MG/ML SOLN COMPARISON:  None. FINDINGS: Cardiovascular: Heart size is normal. No pericardial effusion. Nonaneurysmal  thoracic aorta without dissection. No acute pulmonary embolus to the segmental level. Mediastinum/Nodes: Relative thickened appearance of the distal esophagus, series 5/67 and series 8/45 is noted, measuring approximately 2.2 x 2.1 x 2 cm, nonspecific but possibly representing an impacted food bolus. Distal esophageal thickening from an inflammatory or neoplastic abnormality is not entirely excluded but believed less likely. No proximal dilatation of the esophagus is noted. No adenopathy is seen. Patent trachea and  mainstem bronchi. Lungs/Pleura: Lungs are clear. No pleural effusion or pneumothorax. Upper Abdomen: No acute abnormality. Musculoskeletal: No chest wall abnormality. No acute or significant osseous findings. Review of the MIP images confirms the above findings. IMPRESSION: Soft tissue prominence of the distal esophagus measuring approximately 2.2 x 2.1 x 2 cm, nonspecific but differential possibilities may include impacted food bolus, inflammatory thickening or less likely neoplasm. An upper GI study or direct visual correlation may prove useful. No acute cardiopulmonary abnormality. Electronically Signed   By: Ashley Royalty M.D.   On: 04/17/2018 00:30   Dg Chest Portable 1 View  Result Date: 04/16/2018 CLINICAL DATA:  Foreign body obstructing throat. Difficulty swallowing. EXAM: PORTABLE CHEST 1 VIEW COMPARISON:  None. FINDINGS: The heart size and mediastinal contours are within normal limits. Both lungs are clear. The visualized skeletal structures are unremarkable. IMPRESSION: No active disease. Electronically Signed   By: Dorise Bullion III M.D   On: 04/16/2018 20:22      Allergies as of 04/17/2018   No Known Allergies     Medication List    TAKE these medications   omeprazole 40 MG capsule Commonly known as:  PRILOSEC Take 1 capsule (40 mg total) by mouth 2 (two) times daily.           Management plans discussed with tGI Stable for discharge   Patient should follow up with pcp  CODE STATUS:     Code Status Orders  (From admission, onward)         Start     Ordered   04/17/18 0255  Full code  Continuous     04/17/18 0255        Code Status History    This patient has a current code status but no historical code status.    Advance Directive Documentation     Most Recent Value  Type of Advance Directive  Healthcare Power of Attorney  Pre-existing out of facility DNR order (yellow form or pink MOST form)  -  "MOST" Form in Place?  -      TOTAL TIME TAKING CARE OF  THIS PATIENT: 38 minutes.    Note: This dictation was prepared with Dragon dictation along with smaller phrase technology. Any transcriptional errors that result from this process are unintentional.  Alice Vitelli M.D on 04/17/2018 at 11:44 AM  Between 7am to 6pm - Pager - [phone] After 6pm go to [email or web address] - password [email or web address] [facility]  Office  [phone]  CC: Primary care physician; Birdie Sons, MD

## 2018-04-17 NOTE — Anesthesia Post-op Follow-up Note (Signed)
Anesthesia QCDR form completed.        

## 2018-04-17 NOTE — Anesthesia Procedure Notes (Signed)
Date/Time: 04/17/2018 11:26 AM Performed by: Doreen Salvage, CRNA Pre-anesthesia Checklist: Patient identified, Emergency Drugs available, Suction available and Patient being monitored Patient Re-evaluated:Patient Re-evaluated prior to induction Oxygen Delivery Method: Nasal cannula Induction Type: IV induction Dental Injury: Teeth and Oropharynx as per pre-operative assessment  Comments: Nasal cannula with etCO2 monitoring

## 2018-04-17 NOTE — ED Notes (Signed)
Report was called and given to Lorriane Shire, [place]

## 2018-04-17 NOTE — H&P (Signed)
[facility] at [place] NAME: Gregory Villanueva    MR#:  [id number]  DATE OF BIRTH:  December 11, 1990  DATE OF ADMISSION:  04/16/2018  PRIMARY CARE PHYSICIAN: Birdie Sons, MD   REQUESTING/REFERRING PHYSICIAN:   CHIEF COMPLAINT:   Chief Complaint  Patient presents with  . Foreign Body    HISTORY OF PRESENT ILLNESS: Gregory Villanueva  is a 27 y.o. male with a known history of cerebral palsy. His ability to provide history is limited secondary to cerebral palsy.  Information is taken from reviewing the medical chart and from discussion with emergency room physician and patient's mom. Patient was brought to emergency room after choking episode.  He was eating a [employer] chicken sandwich when he suddenly choked on partially chewed chicken.  Patient's mom provided Heimlich maneuver twice and patient was able to spit out several large unchewed pieces of chicken.  However, he continued to have chest discomfort and was unable to swallow liquids.  Per family, he threw up several times, mostly just bilious vomiting, no blood.   He was treated with glucagon and Ativan in the emergency room, improvement in his symptoms. Patient currently feels better, denies nausea or shortness of breath.  He is able to swallow liquids. However, he remains tachycardic, heart rate in 120s.  Blood tests reveal elevated WBC at 15.2. CT scan of the chest reveals soft tissue prominence of the distal esophagus measuring approximately 2.2 x 2.1 x 2 cm, nonspecific but differential possibilities may include impacted food bolus.  Patient is admitted for further evaluation and treatment.   PAST MEDICAL HISTORY:   Past Medical History:  Diagnosis Date  . Cerebral palsy ([place])   . CP (cerebral palsy), spastic, quadriplegic ([place])   . History of chickenpox     PAST SURGICAL HISTORY:  Past Surgical History:  Procedure Laterality Date  . Dorsal Rhinoplasty  1998  . Various muscle and  Tendon Surgeries     1992-2000    SOCIAL HISTORY:  Social History   Tobacco Use  . Smoking status: Never Smoker  . Smokeless tobacco: Never Used  Substance Use Topics  . Alcohol use: No    FAMILY HISTORY:  Family History  Problem Relation Age of Onset  . Pancreatic cancer Father 68  . Hypertension Maternal Grandmother   . Hypertension Maternal Grandfather   . Prostate cancer Neg Hx   . Colon cancer Neg Hx     DRUG ALLERGIES: No Known Allergies  REVIEW OF SYSTEMS:   See as above, under HPI. Other details, unable to obtain, due to patient's cerebral palsy.  MEDICATIONS AT HOME:  Prior to Admission medications   Not on File      PHYSICAL EXAMINATION:   VITAL SIGNS: Blood pressure 107/62, pulse (!) 121, temperature 98.3 F (36.8 C), temperature source Oral, resp. rate 19, height 5\' 7"  (1.702 m), weight 73 kg, SpO2 100 %.  GENERAL:  27 y.o.-year-old patient lying in the bed with no acute distress.  EYES: Pupils equal, round, reactive to light and accommodation. No scleral icterus. Extraocular muscles intact.  HEENT: Head atraumatic, normocephalic. Oropharynx and nasopharynx clear.  NECK:  Supple, no jugular venous distention. No thyroid enlargement, no tenderness.  LUNGS: Normal breath sounds bilaterally, no wheezing, rales,rhonchi or crepitation. No use of accessory muscles of respiration.  CARDIOVASCULAR: S1, S2 normal. No S3/S4.  ABDOMEN: Soft, nontender, nondistended. Bowel sounds present. No organomegaly or mass.  EXTREMITIES: No pedal edema, cyanosis.  NEUROLOGIC: Upper and lower extremities muscle atrophy, secondary to cerebral palsy noted. PSYCHIATRIC: The patient is alert and oriented x 3.  SKIN: No obvious rash, lesion, or ulcer.   LABORATORY PANEL:   CBC Recent Labs  Lab 04/16/18 2005  WBC 15.2*  HGB 15.1  HCT 43.9  PLT 299  MCV 87.4  MCH 30.1  MCHC 34.4  RDW 13.5  LYMPHSABS 1.5  MONOABS 0.9  EOSABS 0.2  BASOSABS 0.0    ------------------------------------------------------------------------------------------------------------------  Chemistries  Recent Labs  Lab 04/16/18 2005  NA 141  K 3.7  CL 106  CO2 22  GLUCOSE 106*  BUN 14  CREATININE 0.85  CALCIUM 9.4  AST 25  ALT 21  ALKPHOS 73  BILITOT 0.8   ------------------------------------------------------------------------------------------------------------------ estimated creatinine clearance is 121 mL/min (by C-G formula based on SCr of 0.85 mg/dL). ------------------------------------------------------------------------------------------------------------------ No results for input(s): TSH, T4TOTAL, T3FREE, THYROIDAB in the last 72 hours.  Invalid input(s): FREET3   Coagulation profile No results for input(s): INR, PROTIME in the last 168 hours. ------------------------------------------------------------------------------------------------------------------- No results for input(s): DDIMER in the last 72 hours. -------------------------------------------------------------------------------------------------------------------  Cardiac Enzymes No results for input(s): CKMB, TROPONINI, MYOGLOBIN in the last 168 hours.  Invalid input(s): CK ------------------------------------------------------------------------------------------------------------------ Invalid input(s): POCBNP  ---------------------------------------------------------------------------------------------------------------  Urinalysis No results found for: COLORURINE, APPEARANCEUR, LABSPEC, PHURINE, GLUCOSEU, HGBUR, BILIRUBINUR, KETONESUR, PROTEINUR, UROBILINOGEN, NITRITE, LEUKOCYTESUR   RADIOLOGY: Ct Angio Chest Pe W And/or Wo Contrast  Result Date: 04/17/2018 CLINICAL DATA:  Template of 4 body obstruction in his throat. Difficulty swallowing. EXAM: CT ANGIOGRAPHY CHEST WITH CONTRAST TECHNIQUE: Multidetector CT imaging of the chest was performed using the standard  protocol during bolus administration of intravenous contrast. Multiplanar CT image reconstructions and MIPs were obtained to evaluate the vascular anatomy. CONTRAST:  83mL OMNIPAQUE IOHEXOL 350 MG/ML SOLN COMPARISON:  None. FINDINGS: Cardiovascular: Heart size is normal. No pericardial effusion. Nonaneurysmal thoracic aorta without dissection. No acute pulmonary embolus to the segmental level. Mediastinum/Nodes: Relative thickened appearance of the distal esophagus, series 5/67 and series 8/45 is noted, measuring approximately 2.2 x 2.1 x 2 cm, nonspecific but possibly representing an impacted food bolus. Distal esophageal thickening from an inflammatory or neoplastic abnormality is not entirely excluded but believed less likely. No proximal dilatation of the esophagus is noted. No adenopathy is seen. Patent trachea and mainstem bronchi. Lungs/Pleura: Lungs are clear. No pleural effusion or pneumothorax. Upper Abdomen: No acute abnormality. Musculoskeletal: No chest wall abnormality. No acute or significant osseous findings. Review of the MIP images confirms the above findings. IMPRESSION: Soft tissue prominence of the distal esophagus measuring approximately 2.2 x 2.1 x 2 cm, nonspecific but differential possibilities may include impacted food bolus, inflammatory thickening or less likely neoplasm. An upper GI study or direct visual correlation may prove useful. No acute cardiopulmonary abnormality. Electronically Signed   By: Ashley Royalty M.D.   On: 04/17/2018 00:30   Dg Chest Portable 1 View  Result Date: 04/16/2018 CLINICAL DATA:  Foreign body obstructing throat. Difficulty swallowing. EXAM: PORTABLE CHEST 1 VIEW COMPARISON:  None. FINDINGS: The heart size and mediastinal contours are within normal limits. Both lungs are clear. The visualized skeletal structures are unremarkable. IMPRESSION: No active disease. Electronically Signed   By: Dorise Bullion III M.D   On: 04/16/2018 20:22    EKG: Orders placed  or performed during the hospital encounter of 04/16/18  . ED EKG  . ED EKG  . EKG 12-Lead  . EKG 12-Lead    IMPRESSION AND PLAN:  1.  Food bolus impaction.  Gastroenterology is consulted for possible endoscopy in a.m.  Continue supportive measures with IV fluids and antinausea medications. 2.  Leukocytosis and tachycardia.  There is concern for aspiration pneumonia.  We will start Unasyn IV. 3.  Cerebral palsy, wheelchair-bound.  Continue supportive care.  Patient lives at home with his family, parents and sister, who take care of him.   All the records are reviewed and case discussed with ED provider. Management plans discussed with the patient, family and they are in agreement.  CODE STATUS: Full    TOTAL TIME TAKING CARE OF THIS PATIENT: 45 minutes.    Amelia Jo M.D on 04/17/2018 at 1:46 AM  Between 7am to 6pm - Pager - [phone]  After 6pm go to [email or web address] - password [email or web address] [facility] [place] [facility]  [phone]  CC: Primary care physician; Birdie Sons, MD

## 2018-04-17 NOTE — Telephone Encounter (Signed)
Left vm for pt top call office and schedule 4 week fu

## 2018-04-17 NOTE — Anesthesia Preprocedure Evaluation (Addendum)
Anesthesia Evaluation  Patient identified by MRN, date of birth, ID band Patient awake    Reviewed: Allergy & Precautions, H&P , NPO status , Patient's Chart, lab work & pertinent test results  History of Anesthesia Complications (+) PONV and history of anesthetic complications  Airway Mallampati: III  TM Distance: <3 FB Neck ROM: full    Dental no notable dental hx. (+) Teeth Intact   Pulmonary neg pulmonary ROS,    breath sounds clear to auscultation       Cardiovascular negative cardio ROS   Rhythm:regular Rate:Normal     Neuro/Psych neg Seizures  Neuromuscular disease (cerebral palsy with spastic quadriplegia) negative neurological ROS  negative psych ROS   GI/Hepatic negative GI ROS, Neg liver ROS,   Endo/Other  negative endocrine ROS  Renal/GU negative Renal ROS  negative genitourinary   Musculoskeletal   Abdominal   Peds  Hematology negative hematology ROS (+)   Anesthesia Other Findings Past Medical History: No date: Cerebral palsy ([facility]) No date: CP (cerebral palsy), spastic, quadriplegic ([facility]) No date: History of chickenpox  Past Surgical History: 1998: Dorsal Rhinoplasty No date: Various muscle and Tendon Surgeries     Comment:  1992-2000  BMI    Body Mass Index:  24.32 kg/m      Reproductive/Obstetrics negative OB ROS                            Anesthesia Physical Anesthesia Plan  ASA: III  Anesthesia Plan: General and General ETT   Post-op Pain Management:    Induction: Intravenous, Rapid sequence and Cricoid pressure planned  PONV Risk Score and Plan: Propofol infusion and Dexamethasone  Airway Management Planned: Oral ETT  Additional Equipment:   Intra-op Plan:   Post-operative Plan:   Informed Consent: I have reviewed the patients History and Physical, chart, labs and discussed the procedure including the risks, benefits and alternatives for the  proposed anesthesia with the patient or authorized representative who has indicated his/her understanding and acceptance.   Dental Advisory Given  Plan Discussed with: Anesthesiologist, CRNA and Surgeon  Anesthesia Plan Comments:        Anesthesia Quick Evaluation

## 2018-04-17 NOTE — Transfer of Care (Signed)
Immediate Anesthesia Transfer of Care Note  Patient: Gregory Villanueva  Procedure(s) Performed: Procedure(s): ESOPHAGOGASTRODUODENOSCOPY (EGD) WITH PROPOFOL (N/A)  Patient Location: PACU and Endoscopy Unit  Anesthesia Type:General  Level of Consciousness: sedated  Airway & Oxygen Therapy: Patient Spontanous Breathing and Patient connected to nasal cannula oxygen  Post-op Assessment: Report given to RN and Post -op Vital signs reviewed and stable  Post vital signs: Reviewed and stable  Last Vitals:  Vitals:   04/17/18 1140 04/17/18 1145  BP: 120/72 120/72  Pulse: (!) 125 (!) 128  Resp: 20 (!) 21  Temp: (!) 36.4 C   SpO2: 35% 36%    Complications: No apparent anesthesia complications

## 2018-04-17 NOTE — Progress Notes (Signed)
Pt discharged to home via his home wheelchair without incident per MD order. Prior to d/c, pt tolerating PO liquids. Dr. Benjie Karvonen in to see patient during the discharge teachings. MD states that pt doesn't have to wait to eat to d/c. All discharge teachings done both written and verbal with patient and mother. All questions answered. Mother and patient aware of follow ups with Dr. Alm Bustard and Dr. Marius Ditch.

## 2018-04-17 NOTE — Op Note (Signed)
[facility] Patient Name: Gregory Villanueva Procedure Date: 04/17/2018 11:23 AM MRN: [id number] Account #: [id number] Date of Birth: April 23, 1991 Admit Type: Inpatient Age: 27 Room: [facility] ENDO ROOM 4 Gender: Male Note Status: Finalized Procedure:            Upper GI endoscopy Indications:          Esophageal dysphagia Providers:            Lin Landsman MD, MD Referring MD:         Kirstie Peri. Caryn Section, MD (Referring MD) Medicines:            Monitored Anesthesia Care Complications:        No immediate complications. Estimated blood loss:                        Minimal. Procedure:            Pre-Anesthesia Assessment:                       - Prior to the procedure, a History and Physical was                        performed, and patient medications and allergies were                        reviewed. The patient is unable to give consent                        secondary to the patient being legally incompetent to                        consent. The risks and benefits of the procedure and                        the sedation options and risks were discussed with the                        patient's parent. All questions were answered and                        informed consent was obtained. Patient identification                        and proposed procedure were verified by the physician,                        the nurse, the anesthesiologist, the anesthetist and                        the technician in the pre-procedure area in the                        procedure room in the endoscopy suite. Mental Status                        Examination: alert and oriented. Airway Examination:                        normal oropharyngeal airway and neck mobility.  Respiratory Examination: clear to auscultation. CV                        Examination: tachycardia noted. Prophylactic                        Antibiotics: The patient does not require  prophylactic                        antibiotics. Prior Anticoagulants: The patient has                        taken no previous anticoagulant or antiplatelet agents.                        ASA Grade Assessment: III - A patient with severe                        systemic disease. After reviewing the risks and                        benefits, the patient was deemed in satisfactory                        condition to undergo the procedure. The anesthesia plan                        was to use monitored anesthesia care (MAC). Immediately                        prior to administration of medications, the patient was                        re-assessed for adequacy to receive sedatives. The                        heart rate, respiratory rate, oxygen saturations, blood                        pressure, adequacy of pulmonary ventilation, and                        response to care were monitored throughout the                        procedure. The physical status of the patient was                        re-assessed after the procedure.                       After obtaining informed consent, the endoscope was                        passed under direct vision. Throughout the procedure,                        the patient's blood pressure, pulse, and oxygen                        saturations were monitored  continuously. The Endoscope                        was introduced through the mouth, and advanced to the                        second part of duodenum. The upper GI endoscopy was                        accomplished without difficulty. The patient tolerated                        the procedure well. Findings:      The duodenal bulb and second portion of the duodenum were normal.      The entire examined stomach was normal.      Mucosal changes including feline appearance, longitudinal furrows and       congestion (edema) were found in the entire esophagus. Esophageal       findings were graded using  the Eosinophilic Esophagitis Endoscopic       Reference Score (EoE-EREFS) as: Edema Grade 1 Present (decreased clarity       or absence of vascular markings), Rings Grade 1 Mild (subtle       circumferential ridges seen on esophageal distension), Exudates Grade 1       Mild (scattered white lesions involving less than 10 percent of the       esophageal surface area), Furrows Grade 1 Present (vertical lines with       or without visible depth) and Stricture none (no stricture found).       Biopsies were obtained from the proximal and distal esophagus with cold       forceps for histology of suspected eosinophilic esophagitis.      Esophagogastric landmarks were identified: the gastroesophageal junction       was found at 39 cm from the incisors. Impression:           - Normal duodenal bulb and second portion of the                        duodenum.                       - Normal stomach.                       - Esophageal mucosal changes consistent with                        eosinophilic esophagitis. Biopsied.                       - Esophagogastric landmarks identified. Recommendation:       - Discharge patient to home (with parent).                       - Chopped diet.                       - Continue present medications.                       - Await pathology results.                       -  Use Prilosec (omeprazole) 40 mg PO BID.                       - Return to my office in 4 weeks. Procedure Code(s):    --- Professional ---                       [phone], Esophagogastroduodenoscopy, flexible, transoral;                        with biopsy, single or multiple Diagnosis Code(s):    --- Professional ---                       K22.8, Other specified diseases of esophagus                       R13.14, Dysphagia, pharyngoesophageal phase CPT copyright 2017 American Medical Association. All rights reserved. The codes documented in this report are preliminary and upon coder review may  be  revised to meet current compliance requirements. Dr. Ulyess Mort Lin Landsman MD, MD 04/17/2018 11:41:11 AM This report has been signed electronically. Number of Addenda: 0 Note Initiated On: 04/17/2018 11:23 AM      [facility]

## 2018-04-17 NOTE — Progress Notes (Signed)
Pt transferred to endoscopy via hospital bed without incident per MD order accompanied by family. Prior to transfer to endo, report called to [place] RN in endo. No change in patient from AM assessment. Vital signs stable.

## 2018-04-17 NOTE — Telephone Encounter (Signed)
Pt is scheduled for hospital F/U on 04/24/18 @ 3 pm. Pt is being discharged today. Please advise. Thanks TNP

## 2018-04-17 NOTE — Telephone Encounter (Signed)
-----   Message from Lin Landsman, MD sent at 04/17/2018 11:44 AM EDT ----- Regarding: new pt Schedule with me in 4 weeks  Dx: Dysphagia  RV

## 2018-04-17 NOTE — Progress Notes (Signed)
Pharmacy Antibiotic Note  Gregory Villanueva is a 27 y.o. male admitted on 04/16/2018 with aspiration pneumonia.  Pharmacy has been consulted for Unasyn dosing.  Plan: Unasyn 3 grams q 6 hours ordered  Height: 5\' 9"  (175.3 cm) Weight: 164 lb 10.9 oz (74.7 kg) IBW/kg (Calculated) : 70.7  Temp (24hrs), Avg:98.7 F (37.1 C), Min:97.9 F (36.6 C), Max:99.9 F (37.7 C)  Recent Labs  Lab 04/16/18 2005  WBC 15.2*  CREATININE 0.85    Estimated Creatinine Clearance: 129.4 mL/min (by C-G formula based on SCr of 0.85 mg/dL).    No Known Allergies  Antimicrobials this admission: Unasyn 9/4  >>    >>   Dose adjustments this admission:   Microbiology results:   Thank you for allowing pharmacy to be a part of this patient's care.  Gregory Villanueva S 04/17/2018 3:35 AM

## 2018-04-17 NOTE — Progress Notes (Signed)
Pt returned to the room via hospital bed per MD order without incident. Family in room. Pt denies pain. Vital signs stable on admit back to the floor. Pt was able to tolerate PO liquids in endo. Pt and pt's family requests to be d/c.

## 2018-04-17 NOTE — Consult Note (Signed)
Cephas Darby, MD [address]  [address]  [place], [place] [address]  Main: [phone]  Fax: [phone] Pager: [phone]   Consultation  Referring Provider:     No ref. provider found Primary Care Physician:  Birdie Sons, MD Primary Gastroenterologist:  Dr. Sherri Sear         Reason for Consultation:     Food bolus impaction  Date of Admission:  04/16/2018 Date of Consultation:  04/17/2018         HPI:   Gregory Villanueva is a 27 y.o. male with CP, was at baseball yesterday evening, had chicken bites from chick-fila which got stuck. His mother who is paramedics performed Heimlich's resulting in throwing up of 2 bites. He was still having ongoing discomfort and unable to swallow saliva, brought to ER. Received glucagon with no benefit. GI was called for urgent EGD. When I arrived to endo unit, pt was confortable and he felt the food bolus passed, he was able to drink gingerale. He drank small can of gingerale without any discomfort. Therefore, the decision was made to forego EGD and pt was sent back to ER for discharge. Per ER attending, he remained tachycardic, heart rate in 120s.  Blood tests reveal elevated WBC at 15.2. CT scan of the chest reveals soft tissue prominence of the distal esophagus measuring approximately 2.2 x 2.1 x 2 cm, nonspecific but differential possibilities may include impacted food bolus.  Patient is admitted for further evaluation and treatment. Kept NPO Pt denies any GI symptoms at baseline He is not on PPI as outpt  NSAIDs: none  Antiplts/Anticoagulants/Anti thrombotics: none  GI Procedures: none  Past Medical History:  Diagnosis Date  . Cerebral palsy ([place])   . CP (cerebral palsy), spastic, quadriplegic ([place])   . History of chickenpox     Past Surgical History:  Procedure Laterality Date  . Dorsal Rhinoplasty  1998  . Various muscle and Tendon Surgeries     1992-2000    Prior to Admission medications   Not on File     Family History  Problem Relation Age of Onset  . Pancreatic cancer Father 67  . Hypertension Maternal Grandmother   . Hypertension Maternal Grandfather   . Prostate cancer Neg Hx   . Colon cancer Neg Hx      Social History   Tobacco Use  . Smoking status: Never Smoker  . Smokeless tobacco: Never Used  Substance Use Topics  . Alcohol use: No  . Drug use: No    Allergies as of 04/16/2018  . (No Known Allergies)    Review of Systems:    All systems reviewed and negative except where noted in HPI.   Physical Exam:  Vital signs in last 24 hours: Temp:  [97.7 F (36.5 C)-99.9 F (37.7 C)] 97.7 F (36.5 C) (09/04 0734) Pulse Rate:  [99-126] 111 (09/04 0734) Resp:  [9-22] 18 (09/04 0250) BP: (107-135)/(62-88) 134/78 (09/04 0734) SpO2:  [96 %-100 %] 99 % (09/04 0734) Weight:  [73 kg-74.7 kg] 74.7 kg (09/04 0250) Last BM Date: 04/15/18 General:   Pleasant, cooperative in NAD Head:  Normocephalic and atraumatic. Eyes:   No icterus.   Conjunctiva pink. PERRLA. Ears:  Normal auditory acuity. Neck:  Supple; no masses or thyroidomegaly Lungs: Respirations even and unlabored. Lungs clear to auscultation bilaterally.   No wheezes, crackles, or rhonchi.  Heart:  Regular rhythm, tachycardia,  Without murmur, clicks, rubs or gallops Abdomen:  Soft,  nondistended, nontender. Normal bowel sounds. No appreciable masses or hepatomegaly.  No rebound or guarding.  Rectal:  Not performed. Msk:  Contractures from cerebral palsy Extremities:  Without edema, cyanosis or clubbing. Neurologic:  Alert and oriented x2;  grossly normal neurologically. Skin:  Intact without significant lesions or rashes. Cervical Nodes:  No significant cervical adenopathy. Psych:  Alert and cooperative. Normal affect.  LAB RESULTS: CBC Latest Ref Rng & Units 04/17/2018 04/16/2018 07/25/2016  WBC 3.8 - 10.6 K/uL 9.0 15.2(H) 6.8  Hemoglobin 13.0 - 18.0 g/dL 14.4 15.1 14.8  Hematocrit 40.0 - 52.0 % 42.4 43.9 42.0   Platelets 150 - 440 K/uL 246 299 362    BMET BMP Latest Ref Rng & Units 04/17/2018 04/16/2018 07/25/2016  Glucose 70 - 99 mg/dL 82 106(H) 103(H)  BUN 6 - 20 mg/dL '11 14 10  '$ Creatinine 0.61 - 1.24 mg/dL 0.71 0.85 0.71(L)  BUN/Creat Ratio 9 - 20 - - 14  Sodium 135 - 145 mmol/L 141 141 139  Potassium 3.5 - 5.1 mmol/L 3.9 3.7 4.4  Chloride 98 - 111 mmol/L 108 106 101  CO2 22 - 32 mmol/L '23 22 20  '$ Calcium 8.9 - 10.3 mg/dL 8.5(L) 9.4 9.6    LFT Hepatic Function Latest Ref Rng & Units 04/16/2018 07/25/2016  Total Protein 6.5 - 8.1 g/dL 8.0 7.1  Albumin 3.5 - 5.0 g/dL 4.6 4.7  AST 15 - 41 U/L 25 13  ALT 0 - 44 U/L 21 11  Alk Phosphatase 38 - 126 U/L 73 81  Total Bilirubin 0.3 - 1.2 mg/dL 0.8 0.3     STUDIES: Ct Angio Chest Pe W And/or Wo Contrast  Result Date: 04/17/2018 CLINICAL DATA:  Template of 4 body obstruction in his throat. Difficulty swallowing. EXAM: CT ANGIOGRAPHY CHEST WITH CONTRAST TECHNIQUE: Multidetector CT imaging of the chest was performed using the standard protocol during bolus administration of intravenous contrast. Multiplanar CT image reconstructions and MIPs were obtained to evaluate the vascular anatomy. CONTRAST:  30m OMNIPAQUE IOHEXOL 350 MG/ML SOLN COMPARISON:  None. FINDINGS: Cardiovascular: Heart size is normal. No pericardial effusion. Nonaneurysmal thoracic aorta without dissection. No acute pulmonary embolus to the segmental level. Mediastinum/Nodes: Relative thickened appearance of the distal esophagus, series 5/67 and series 8/45 is noted, measuring approximately 2.2 x 2.1 x 2 cm, nonspecific but possibly representing an impacted food bolus. Distal esophageal thickening from an inflammatory or neoplastic abnormality is not entirely excluded but believed less likely. No proximal dilatation of the esophagus is noted. No adenopathy is seen. Patent trachea and mainstem bronchi. Lungs/Pleura: Lungs are clear. No pleural effusion or pneumothorax. Upper Abdomen: No acute  abnormality. Musculoskeletal: No chest wall abnormality. No acute or significant osseous findings. Review of the MIP images confirms the above findings. IMPRESSION: Soft tissue prominence of the distal esophagus measuring approximately 2.2 x 2.1 x 2 cm, nonspecific but differential possibilities may include impacted food bolus, inflammatory thickening or less likely neoplasm. An upper GI study or direct visual correlation may prove useful. No acute cardiopulmonary abnormality. Electronically Signed   By: DAshley RoyaltyM.D.   On: 04/17/2018 00:30   Dg Chest Portable 1 View  Result Date: 04/16/2018 CLINICAL DATA:  Foreign body obstructing throat. Difficulty swallowing. EXAM: PORTABLE CHEST 1 VIEW COMPARISON:  None. FINDINGS: The heart size and mediastinal contours are within normal limits. Both lungs are clear. The visualized skeletal structures are unremarkable. IMPRESSION: No active disease. Electronically Signed   By: DDorise BullionIII M.D   On: 04/16/2018  20:22      Impression / Plan:   EMIT KUENZEL is a 27 y.o. white male with CP, came with food bolus impaction which spontaneously passed, admitted overnight as CT showed soft tissue in distal esophagus. This is probably secondary to edema from food impaction.  Will perform EGD today  I have discussed alternative options, risks & benefits,  which include, but are not limited to, bleeding, infection, perforation,respiratory complication & drug reaction.  The patient agrees with this plan & written consent will be obtained.     Thank you for involving me in the care of this patient.      LOS: 0 days   Sherri Sear, MD  04/17/2018, 8:49 AM   Note: This dictation was prepared with Dragon dictation along with smaller phrase technology. Any transcriptional errors that result from this process are unintentional.

## 2018-04-18 ENCOUNTER — Encounter: Payer: Self-pay | Admitting: Gastroenterology

## 2018-04-18 LAB — HIV ANTIBODY (ROUTINE TESTING W REFLEX): HIV Screen 4th Generation wRfx: NONREACTIVE

## 2018-04-18 NOTE — Anesthesia Postprocedure Evaluation (Signed)
Anesthesia Post Note  Patient: Gregory Villanueva  Procedure(s) Performed: ESOPHAGOGASTRODUODENOSCOPY (EGD) WITH PROPOFOL (N/A )  Patient location during evaluation: PACU Anesthesia Type: General Level of consciousness: awake and alert Pain management: pain level controlled Vital Signs Assessment: post-procedure vital signs reviewed and stable Respiratory status: spontaneous breathing, nonlabored ventilation and respiratory function stable Cardiovascular status: blood pressure returned to baseline and stable Postop Assessment: no apparent nausea or vomiting Anesthetic complications: no     Last Vitals:  Vitals:   04/17/18 1210 04/17/18 1244  BP: 128/78 125/71  Pulse: (!) 116 97  Resp: 16   Temp:    SpO2: 98% 99%    Last Pain:  Vitals:   04/17/18 1330  TempSrc:   PainSc: 0-No pain                 Durenda Hurt

## 2018-04-20 LAB — SURGICAL PATHOLOGY

## 2018-04-24 ENCOUNTER — Ambulatory Visit (INDEPENDENT_AMBULATORY_CARE_PROVIDER_SITE_OTHER): Payer: Medicare Other | Admitting: Family Medicine

## 2018-04-24 ENCOUNTER — Encounter: Payer: Self-pay | Admitting: Family Medicine

## 2018-04-24 VITALS — BP 136/83 | HR 111 | Temp 97.8°F | Resp 18

## 2018-04-24 DIAGNOSIS — G8250 Quadriplegia, unspecified: Secondary | ICD-10-CM

## 2018-04-24 DIAGNOSIS — K200 Eosinophilic esophagitis: Secondary | ICD-10-CM

## 2018-04-24 DIAGNOSIS — J301 Allergic rhinitis due to pollen: Secondary | ICD-10-CM | POA: Diagnosis not present

## 2018-04-24 DIAGNOSIS — R000 Tachycardia, unspecified: Secondary | ICD-10-CM

## 2018-04-24 NOTE — Progress Notes (Signed)
       Patient: Gregory Villanueva Male    DOB: 12/30/1990   27 y.o.   MRN: [id number] Visit Date: 04/24/2018  Today's Provider: Lelon Huh, MD   Chief Complaint  Patient presents with  . Hospitalization Follow-up   Subjective:    HPI  Follow up Hospitalization  Patient was admitted to [facility] on 04/16/2018 and discharged on 04/17/2018. He was treated for Dysphagia and Eosinophilic Esophagitis. Treatment for this included starting PPI twice daily and following up as out patient. He reports good compliance with treatment. He reports this condition is Improved. Patient has an appointment to follow up with GI on 05/27/2018.  ------------------------------------------------------------------------------------   Patient's mother who is a paramedic also reports that his heart rate remains elevated, usually in the 110s to 120. No chest pain or palpitations. In office heart rate has been variable, was 124 in 2017 and 88 in 2015. He does not take any stimulant prescription or OCT medications. BP has been relatively stable.     No Known Allergies   Current Outpatient Medications:  .  omeprazole (PRILOSEC) 40 MG capsule, Take 1 capsule (40 mg total) by mouth 2 (two) times daily., Disp: 30 capsule, Rfl: 1  Review of Systems  Constitutional: Negative for appetite change, chills and fever.  HENT: Positive for congestion (nasal congestion).   Respiratory: Positive for cough (occasionally productive with green mucus). Negative for chest tightness, shortness of breath and wheezing.   Cardiovascular: Negative for chest pain and palpitations.  Gastrointestinal: Negative for abdominal pain, nausea and vomiting.    Social History   Tobacco Use  . Smoking status: Never Smoker  . Smokeless tobacco: Never Used  Substance Use Topics  . Alcohol use: No   Objective:   BP 136/83 (BP Location: Left Arm, Patient Position: Sitting, Cuff Size: Normal)   Pulse (!) 111   Temp 97.8 F (36.6 C) (Oral)    Resp 18   SpO2 95% Comment: room air Vitals:   04/24/18 1517  BP: 136/83  Pulse: (!) 111  Resp: 18  Temp: 97.8 F (36.6 C)  TempSrc: Oral  SpO2: 95%     Physical Exam   General Appearance:    Alert, cooperative, no distress  Eyes:    PERRL, conjunctiva/corneas clear, EOM's intact       Lungs:     Clear to auscultation bilaterally, respirations unlabored  Heart:    Tachycardic rate and regular rhythm  Neurologic:   Awake, alert, oriented x 3.           Assessment & Plan:     1. Eosinophilic esophagitis Doing well on PPI. Follow up with GI as scheduled.   2. Spastic quadriplegia (HCC) Stable.   3. Allergic rhinitis due to pollen, unspecified seasonality Can take OTC non-sedating antihistamines. Avoid decongestants.   4. Tachycardia Highly variable baseline pulse rate. Will check labs. If labs and stays consistently in the 120 range may consider betablocker.  - T4, free - TSH - Magnesium        Lelon Huh, MD  [facility]

## 2018-04-24 NOTE — Patient Instructions (Addendum)
   Try OTC nasal steroid such as Flonase are Nasocort   Avoid oral decongestants

## 2018-04-25 LAB — MAGNESIUM: Magnesium: 2.2 mg/dL (ref 1.6–2.3)

## 2018-04-25 LAB — T4, FREE: Free T4: 1.33 ng/dL (ref 0.82–1.77)

## 2018-04-25 LAB — TSH: TSH: 2.49 u[IU]/mL (ref 0.450–4.500)

## 2018-04-29 ENCOUNTER — Telehealth: Payer: Self-pay | Admitting: *Deleted

## 2018-04-29 NOTE — Telephone Encounter (Signed)
LMOVM for pt's mother to return call.

## 2018-04-29 NOTE — Telephone Encounter (Signed)
-----   Message from Birdie Sons, MD sent at 04/27/2018  9:50 PM EDT ----- Magnesium and thyroid functions are normal. Keep a check on heart rate. If it stays consistently above 120 then we can try a low dose of metoprolol to regulate it.

## 2018-04-30 NOTE — Telephone Encounter (Signed)
Patients mother advised as below.  °

## 2018-05-27 ENCOUNTER — Encounter: Payer: Self-pay | Admitting: Gastroenterology

## 2018-05-27 ENCOUNTER — Ambulatory Visit (INDEPENDENT_AMBULATORY_CARE_PROVIDER_SITE_OTHER): Payer: Medicare Other | Admitting: Gastroenterology

## 2018-05-27 ENCOUNTER — Other Ambulatory Visit: Payer: Self-pay

## 2018-05-27 VITALS — BP 146/85 | HR 134 | Resp 18 | Wt 165.0 lb

## 2018-05-27 DIAGNOSIS — K200 Eosinophilic esophagitis: Secondary | ICD-10-CM

## 2018-05-27 DIAGNOSIS — R1310 Dysphagia, unspecified: Secondary | ICD-10-CM

## 2018-05-27 NOTE — Progress Notes (Signed)
Cephas Darby, MD [address]  [place]  [place], [place] [address]  Main: [phone]  Fax: [phone]    Gastroenterology Consultation  Referring Provider:     Birdie Sons, MD Primary Care Physician:  Birdie Sons, MD Primary Gastroenterologist:  Dr. Cephas Darby Reason for Consultation:     Eosinophilic esophagitis        HPI:   Gregory Villanueva is a 27 y.o. male referred by Dr. Birdie Sons, MD  for consultation & management of history of esophagitis.  Patient has history of cerebral palsy, whom I initially met when he presented to [facility] ER with food impaction on 04/17/2018.  At that time, food spontaneously passed after receiving glucagon.  He did underwent endoscopy which had endoscopic features suggestive of EoE and pathology confirmed the same.  I have started him on Prilosec 40 mg twice daily.  He is here for hospital follow-up He is accompanied by his mom today  Interval summary: Patient reports that he has been doing fairly well.  There are occasions where he has sensation of food stuck in his throat when he tries to eat fast and not chewed well.  His mom reported that they are working on it.  He denies any other GI symptoms otherwise  NSAIDs: None  Antiplts/Anticoagulants/Anti thrombotics: None  GI Procedures:  EGD 04/17/2018 - Normal duodenal bulb and second portion of the duodenum. - Normal stomach. - Esophageal mucosal changes consistent with eosinophilic esophagitis. Biopsied. DIAGNOSIS:  A. ESOPHAGUS, DISTAL; COLD BIOPSY:  - STRATIFIED SQUAMOUS EPITHELIUM WITH UP TO 25 EOSINOPHILS PER  HIGH-POWER FIELD, AND REACTIVE BASAL HYPERPLASIA AND SPONGIOSIS.  - NEGATIVE FOR DYSPLASIA AND MALIGNANCY.   B. ESOPHAGUS, PROXIMAL; COLD BIOPSY:  - STRATIFIED SQUAMOUS EPITHELIUM WITH UP TO 25 EOSINOPHILS PER  HIGH-POWER FIELD, AND MARKED REACTIVE SPONGIOSIS, SEE COMMENT.  - NEGATIVE FOR DYSPLASIA AND MALIGNANCY.  Past Medical History:  Diagnosis  Date  . Cerebral palsy ([place])   . CP (cerebral palsy), spastic, quadriplegic ([place])   . History of chickenpox     Past Surgical History:  Procedure Laterality Date  . Dorsal Rhinoplasty  1998  . ESOPHAGOGASTRODUODENOSCOPY (EGD) WITH PROPOFOL N/A 04/17/2018   Procedure: ESOPHAGOGASTRODUODENOSCOPY (EGD) WITH PROPOFOL;  Surgeon: Lin Landsman, MD;  Location: [facility];  Service: Gastroenterology;  Laterality: N/A;  . Various muscle and Tendon Surgeries     1992-2000    Current Outpatient Medications:  .  Antipyrine-Benzocaine 5.5-1.4 % SOLN, Place in ear(s)., Disp: , Rfl:  .  omeprazole (PRILOSEC) 40 MG capsule, Take 1 capsule (40 mg total) by mouth 2 (two) times daily., Disp: 30 capsule, Rfl: 1   Family History  Problem Relation Age of Onset  . Pancreatic cancer Father 71  . Hypertension Maternal Grandmother   . Hypertension Maternal Grandfather   . Prostate cancer Neg Hx   . Colon cancer Neg Hx      Social History   Tobacco Use  . Smoking status: Never Smoker  . Smokeless tobacco: Never Used  Substance Use Topics  . Alcohol use: No  . Drug use: No    Allergies as of 05/27/2018  . (No Known Allergies)    Review of Systems:    All systems reviewed and negative except where noted in HPI.   Physical Exam:  BP (!) 146/85 (BP Location: Left Arm, Patient Position: Sitting, Cuff Size: Normal)   Pulse (!) 134   Resp 18   Wt 165 lb (  74.8 kg)   BMI 24.37 kg/m  No LMP for male patient.  General:   Alert,  Well-developed, well-nourished, pleasant and cooperative in NAD Head:  Normocephalic and atraumatic. Eyes:  Sclera clear, no icterus.   Conjunctiva pink. Ears:  Normal auditory acuity. Nose:  No deformity, discharge, or lesions. Mouth:  No deformity or lesions,oropharynx pink & moist. Neck:  Supple; no masses or thyromegaly. Lungs:  Respirations even and unlabored.  Clear throughout to auscultation.   No wheezes, crackles, or rhonchi. No acute  distress. Heart:  Regular rate and rhythm; no murmurs, clicks, rubs, or gallops. Abdomen:  Normal bowel sounds. Soft, non-tender and non-distended without masses, hepatosplenomegaly or hernias noted.  No guarding or rebound tenderness.   Rectal: Not performed Msk: Came in wheelchair, limited movements secondary to cerebral palsy, mild contractures Pulses:  Normal pulses noted. Extremities:  No clubbing or edema.  No cyanosis. Neurologic:  Alert and oriented x3;  grossly normal neurologically. Skin:  Intact without significant lesions or rashes. No jaundice. Lymph Nodes:  No significant cervical adenopathy. Psych:  Alert and cooperative. Normal mood and affect.  Imaging Studies: No abdominal imaging  Assessment and Plan:   Gregory Villanueva is a 27 y.o. Caucasian male with cerebral palsy, history of leg esophagitis currently in clinical remission on proton pump inhibitors  -Continue Prilosec 40 mg twice daily -Recommend EGD in the next 2 to 3 months to follow-up on response to PPI.  If inflammation is persistent, recommend budesonide slurry or swallow inhaled fluticasone   Follow up after the upper endoscopy   Cephas Darby, MD

## 2018-05-31 ENCOUNTER — Ambulatory Visit: Payer: Medicare Other | Admitting: Gastroenterology

## 2018-08-12 ENCOUNTER — Telehealth: Payer: Self-pay | Admitting: Gastroenterology

## 2018-08-12 NOTE — Telephone Encounter (Signed)
Pt mother is calling to reschedule pt procedure please call

## 2018-08-16 NOTE — Telephone Encounter (Signed)
Returned patient mother's call unable to leave VM due to vm was full

## 2018-08-19 ENCOUNTER — Encounter: Payer: Self-pay | Admitting: Student

## 2018-08-20 ENCOUNTER — Ambulatory Visit: Admission: RE | Admit: 2018-08-20 | Payer: Medicare Other | Source: Home / Self Care | Admitting: Gastroenterology

## 2018-08-20 ENCOUNTER — Encounter: Admission: RE | Payer: Self-pay | Source: Home / Self Care

## 2018-08-20 SURGERY — ESOPHAGOGASTRODUODENOSCOPY (EGD) WITH PROPOFOL
Anesthesia: General

## 2018-08-30 ENCOUNTER — Ambulatory Visit: Payer: Self-pay | Admitting: Family Medicine

## 2018-09-23 ENCOUNTER — Encounter: Payer: Self-pay | Admitting: Family Medicine

## 2018-09-23 ENCOUNTER — Ambulatory Visit (INDEPENDENT_AMBULATORY_CARE_PROVIDER_SITE_OTHER): Payer: Medicare Other | Admitting: Family Medicine

## 2018-09-23 DIAGNOSIS — G800 Spastic quadriplegic cerebral palsy: Secondary | ICD-10-CM

## 2018-09-23 DIAGNOSIS — G8250 Quadriplegia, unspecified: Secondary | ICD-10-CM

## 2018-09-23 NOTE — Progress Notes (Signed)
       Patient: Gregory Villanueva Male    DOB: Feb 05, 1991   28 y.o.   MRN: [id number] Visit Date: 09/23/2018  Today's Provider: Lelon Huh, MD   Chief Complaint  Patient presents with  . Cerebral Palsy   Subjective:   HPI   Pt comes in today needing an order for a new fully motorized wheel chair.   His current chair is over 27 years old and freqently requiring replacement parts. He is unable to operate manual wheelchair due to upper extremity spasticity. He also requires leg fitting to help him stand, as he is unable to stand and transfer to and from chair without assistance.   Pt is also having trouble with seasonal allergies he uses OTC nasal sprays which are effective.   No Known Allergies   Current Outpatient Medications:  .  Antipyrine-Benzocaine 5.5-1.4 % SOLN, Place in ear(s)., Disp: , Rfl:  .  omeprazole (PRILOSEC) 40 MG capsule, Take 1 capsule (40 mg total) by mouth 2 (two) times daily., Disp: 30 capsule, Rfl: 1  Review of Systems  Constitutional: Negative.   Respiratory: Negative.   Cardiovascular: Negative.   Gastrointestinal: Negative.   Musculoskeletal: Positive for gait problem.  Allergic/Immunologic: Positive for environmental allergies.    Social History   Tobacco Use  . Smoking status: Never Smoker  . Smokeless tobacco: Never Used  Substance Use Topics  . Alcohol use: No      Objective:   BP 135/86 (BP Location: Left Arm, Patient Position: Sitting, Cuff Size: Large)   Pulse 99   Temp 98.3 F (36.8 C) (Oral)    Physical Exam  General Appearance:    Alert, cooperative, no distress  Eyes:    PERRL, conjunctiva/corneas clear, EOM's intact       Lungs:     Clear to auscultation bilaterally, respirations unlabored  Heart:    Tachycardic rate and regular rhythm  MS:   Increased tone with some spasm of extremities. Confined to wheelchair.   Neurologic:   Awake, alert, oriented x 3. [place] on appropriate conversation.          Assessment & Plan    1. Spastic quadriplegia (HCC)   2. Spastic quadriplegic cerebral palsy ([place])  Generally doing well, but has aging wheelchair that has become unreliable and requiring frequent repairs. He requires a new fully motorized wheelchair in good working condition with straps to aid in standing. Awaiting appropriate forms from PT.      Lelon Huh, MD  [facility]

## 2018-09-23 NOTE — Patient Instructions (Addendum)
.   Please review the attached list of medications and notify my office if there are any errors.   . Please bring all of your medications to every appointment so we can make sure that our medication list is the same as yours.   

## 2018-12-05 ENCOUNTER — Telehealth: Payer: Self-pay

## 2018-12-05 NOTE — Telephone Encounter (Signed)
Patient mom Levada Dy called asking if Dr. Caryn Section received any paperwork from a company called [employer]? She says that she has been trying to get patient a new wheel chair and the company told her that they are awaiting the return of paperwork from patient's PCP. I didn't see anything from this company in patients chart. Have you seen any paperwork for this patient from [facility]? Patient's mom says she is going to have them refax the paperwork.

## 2018-12-06 ENCOUNTER — Telehealth: Payer: Self-pay

## 2018-12-06 NOTE — Telephone Encounter (Signed)
Evonne at [facility] [phone] wanted to know if the order for his wheelchair was faxed back.

## 2019-08-06 IMAGING — CT CT ANGIO CHEST
2 of 6 series · 19 of 46 positions shown · IV contrast (APPLIED)
Comparison: None.

CLINICAL DATA: Template of 4 body obstruction in his throat.
Difficulty swallowing.

EXAM:
CT ANGIOGRAPHY CHEST WITH CONTRAST
TECHNIQUE: Multidetector CT imaging of the chest was performed using the
standard protocol during bolus administration of intravenous
contrast. Multiplanar CT image reconstructions and MIPs were
obtained to evaluate the vascular anatomy.
CONTRAST:  75mL OMNIPAQUE IOHEXOL 350 MG/ML SOLN

[Series 6: thins · axial · 0.66mm/px · z∈[+527,+786]mm · 16 of 285 slices shown]
[im 13/285  lung]
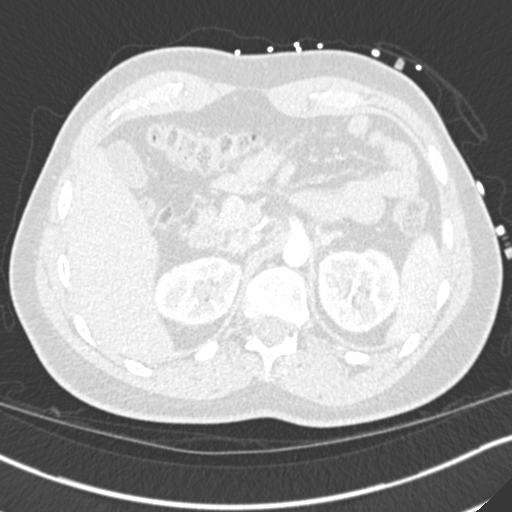
[im 38/285  soft-tissue]
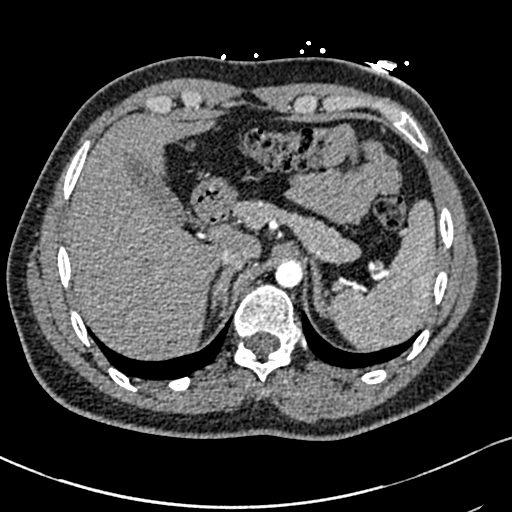
[im 50/285  lung]
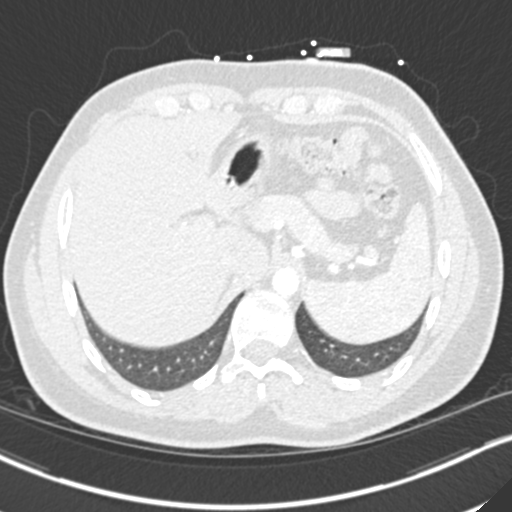
[im 62/285  soft-tissue]
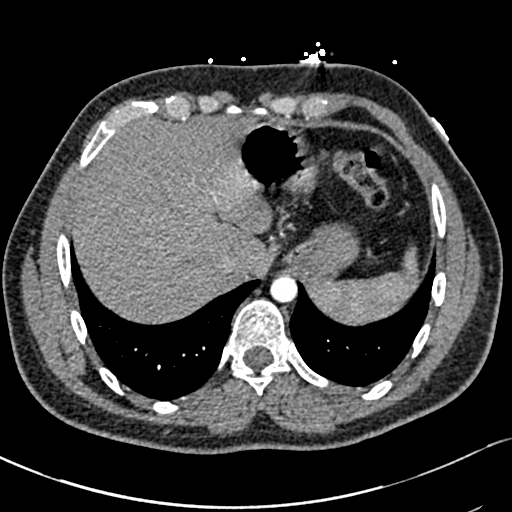
[im 87/285  lung]
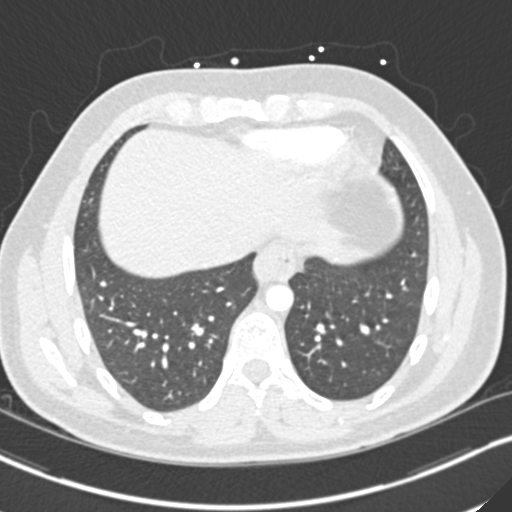
[im 99/285  soft-tissue]
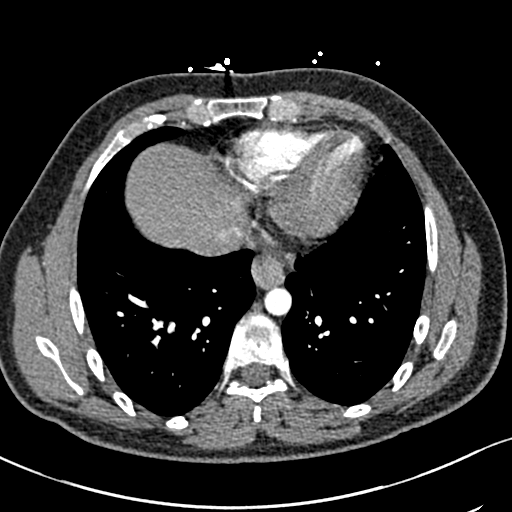
[im 112/285  lung]
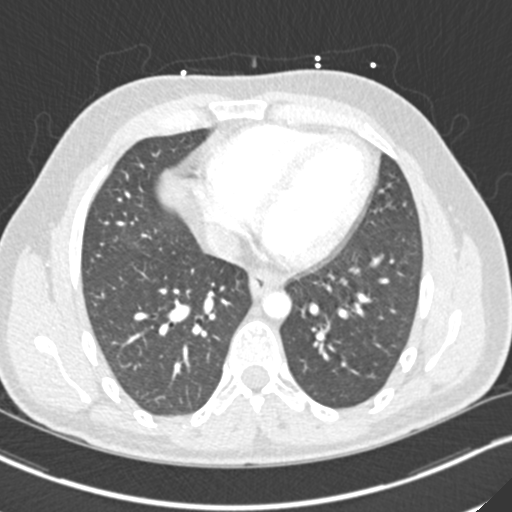
[im 136/285  soft-tissue]
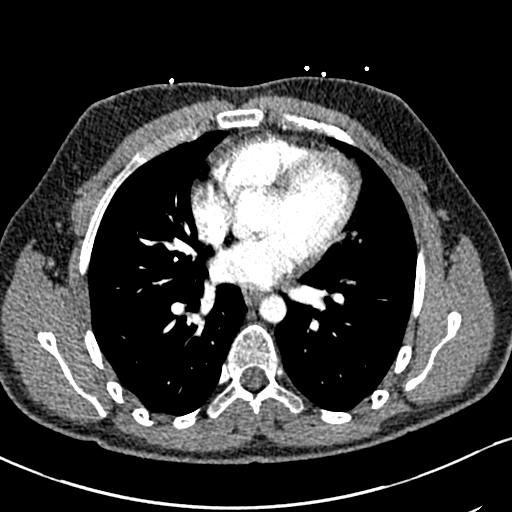
[im 149/285  lung]
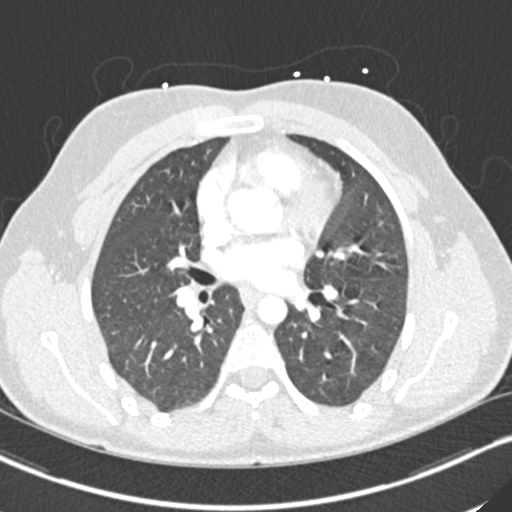
[im 173/285  soft-tissue]
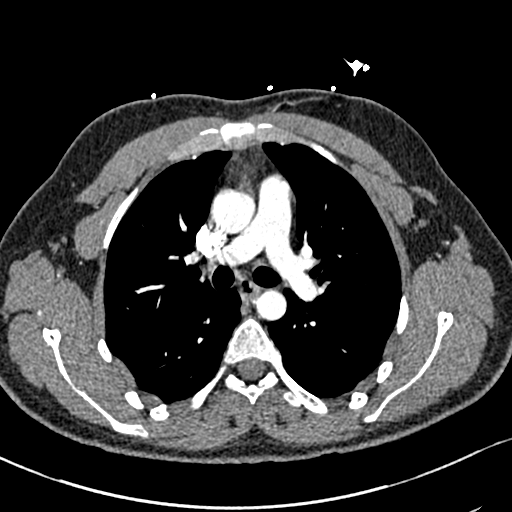
[im 186/285  lung]
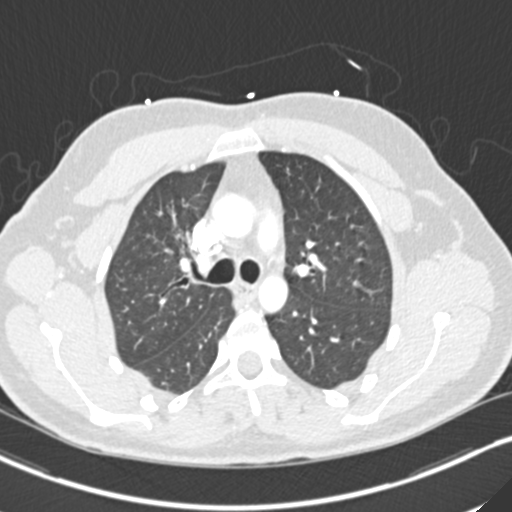
[im 198/285  soft-tissue]
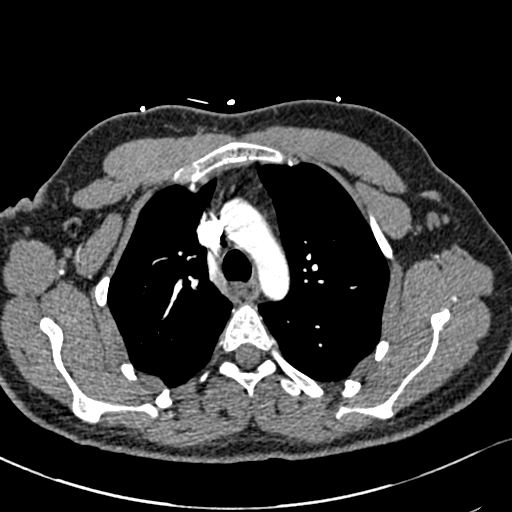
[im 223/285  lung]
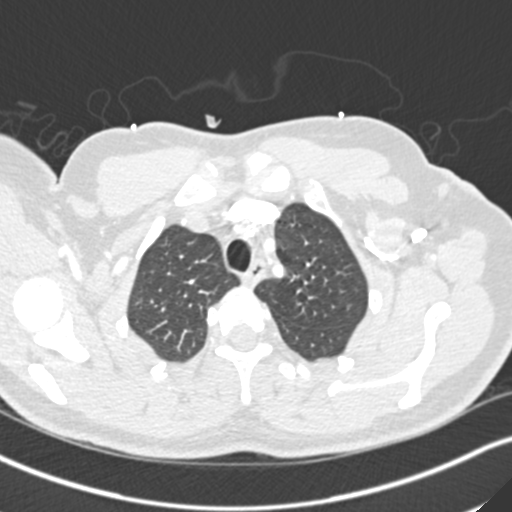
[im 235/285  soft-tissue]
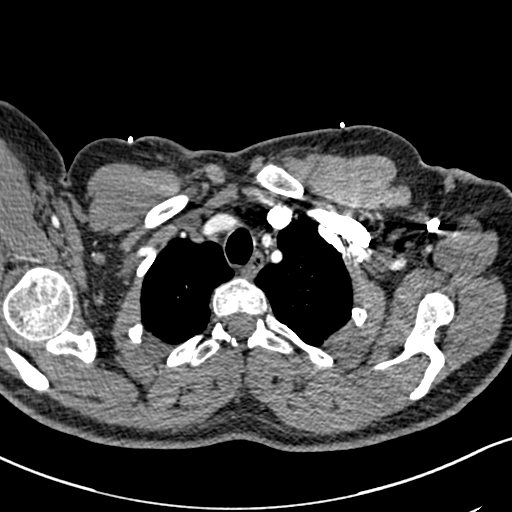
[im 247/285  lung]
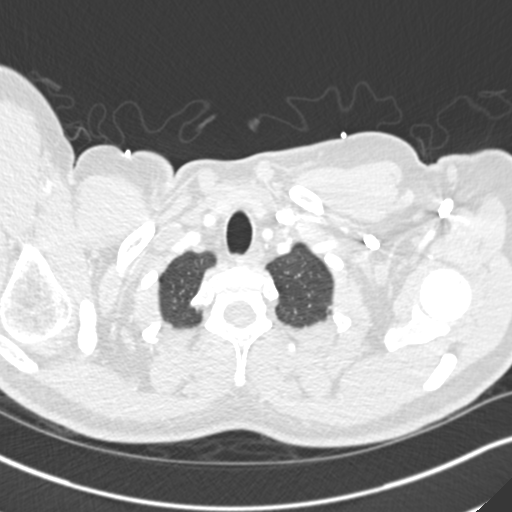
[im 272/285  soft-tissue]
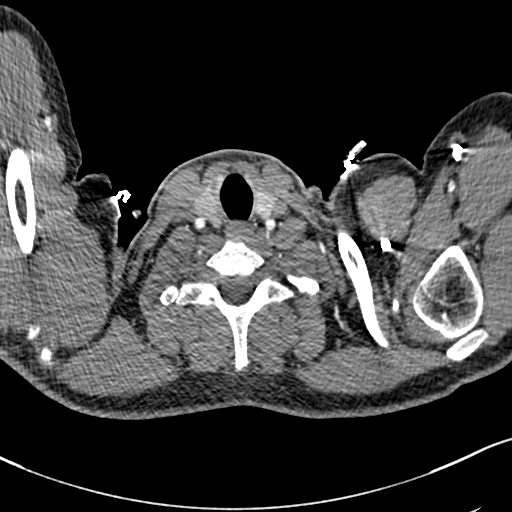

[Series 8: coronal mpr · coronal · 0.62mm/px · 3 of 80 slices shown]
[im 20/80  soft-tissue]
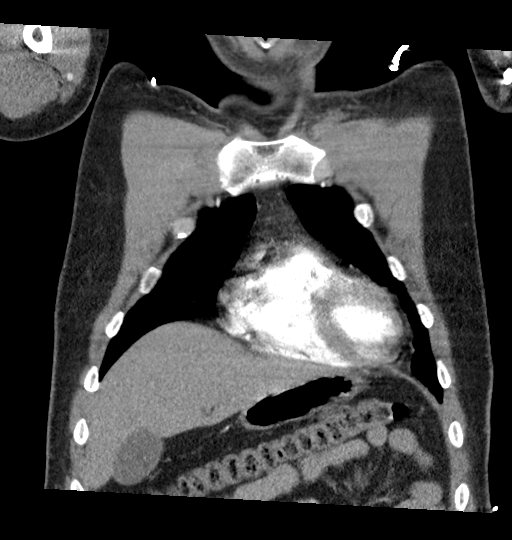
[im 40/80  soft-tissue]
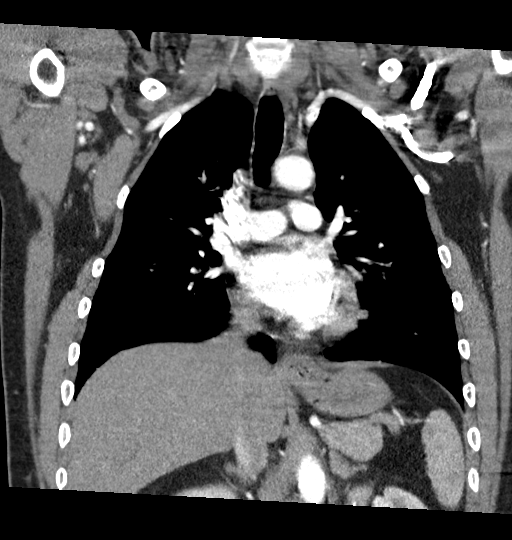
[im 60/80  soft-tissue]
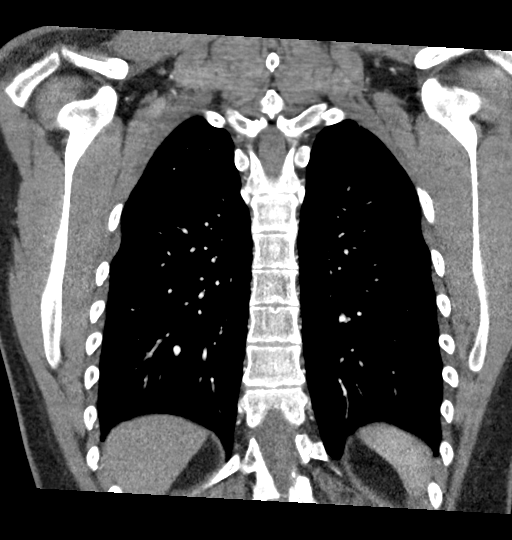

[19 of 46 positions shown; findings below may reference images not displayed]

FINDINGS: Cardiovascular: Heart size is normal. No pericardial effusion.
Nonaneurysmal thoracic aorta without dissection. No acute pulmonary
embolus to the segmental level.

Mediastinum/Nodes: Relative thickened appearance of the distal
esophagus, series [DATE] and series [DATE] is noted, measuring
approximately 2.2 x 2.1 x 2 cm, nonspecific but possibly
representing an impacted food bolus. Distal esophageal thickening
from an inflammatory or neoplastic abnormality is not entirely
excluded but believed less likely. No proximal dilatation of the
esophagus is noted. No adenopathy is seen. Patent trachea and
mainstem bronchi.

Lungs/Pleura: Lungs are clear. No pleural effusion or pneumothorax.

Upper Abdomen: No acute abnormality.

Musculoskeletal: No chest wall abnormality. No acute or significant
osseous findings.

Review of the MIP images confirms the above findings.
IMPRESSION: Soft tissue prominence of the distal esophagus measuring
approximately 2.2 x 2.1 x 2 cm, nonspecific but differential
possibilities may include impacted food bolus, inflammatory
thickening or less likely neoplasm. An upper GI study or direct
visual correlation may prove useful.

No acute cardiopulmonary abnormality.

## 2019-12-08 ENCOUNTER — Telehealth: Payer: Self-pay

## 2019-12-08 NOTE — Telephone Encounter (Signed)
Patient's mom advised. She states she went to the pharmacy and picked up OTC Lotrim. She is going to try this for a week and call back if not improving to schedule office visit.

## 2019-12-08 NOTE — Telephone Encounter (Signed)
OTC Tinactin or Lotrimin usually works as well as any prescription.

## 2019-12-08 NOTE — Telephone Encounter (Signed)
Copied from [facility] [id number]. Topic: General - Inquiry >> Dec 08, 2019  3:48 PM Greggory Keen D wrote: Reason for CRM: Pt's mom called saying her son who is the patient has what she would describe as jock itch.  Patient is wheelchair bound and mom wants to know if she can try to treat OTC or does he need an appt.  CB#  [phone]

## 2020-11-22 ENCOUNTER — Telehealth: Payer: Medicare Other | Admitting: Family Medicine

## 2020-11-22 NOTE — Telephone Encounter (Signed)
Has this form been received yet?

## 2020-11-22 NOTE — Telephone Encounter (Signed)
Allana calling from [facility] is calling to let Dr. Caryn Section know that she will be faxing a letter of medical necessity over for vehicle  modification to make his Lucianne Lei medically accessible. Please advise CB- [phone]

## 2020-12-06 NOTE — Telephone Encounter (Signed)
I called and advised [facility] that we have not received the form. Allana is going to refax the form today. Please let me know when fax come through.

## 2020-12-07 DIAGNOSIS — L819 Disorder of pigmentation, unspecified: Secondary | ICD-10-CM | POA: Diagnosis not present

## 2020-12-07 DIAGNOSIS — R29898 Other symptoms and signs involving the musculoskeletal system: Secondary | ICD-10-CM | POA: Diagnosis not present

## 2020-12-07 DIAGNOSIS — M4146 Neuromuscular scoliosis, lumbar region: Secondary | ICD-10-CM | POA: Diagnosis not present

## 2020-12-07 DIAGNOSIS — G800 Spastic quadriplegic cerebral palsy: Secondary | ICD-10-CM | POA: Diagnosis not present

## 2020-12-09 ENCOUNTER — Other Ambulatory Visit: Payer: Self-pay | Admitting: Physical Medicine and Rehabilitation

## 2020-12-09 DIAGNOSIS — G800 Spastic quadriplegic cerebral palsy: Secondary | ICD-10-CM

## 2021-01-19 DIAGNOSIS — G800 Spastic quadriplegic cerebral palsy: Secondary | ICD-10-CM | POA: Diagnosis not present

## 2021-01-19 DIAGNOSIS — I872 Venous insufficiency (chronic) (peripheral): Secondary | ICD-10-CM | POA: Diagnosis not present

## 2021-01-19 DIAGNOSIS — L819 Disorder of pigmentation, unspecified: Secondary | ICD-10-CM | POA: Diagnosis not present

## 2021-02-21 DIAGNOSIS — D2262 Melanocytic nevi of left upper limb, including shoulder: Secondary | ICD-10-CM | POA: Diagnosis not present

## 2021-02-21 DIAGNOSIS — D225 Melanocytic nevi of trunk: Secondary | ICD-10-CM | POA: Diagnosis not present

## 2021-02-21 DIAGNOSIS — D2261 Melanocytic nevi of right upper limb, including shoulder: Secondary | ICD-10-CM | POA: Diagnosis not present

## 2021-02-21 DIAGNOSIS — D2272 Melanocytic nevi of left lower limb, including hip: Secondary | ICD-10-CM | POA: Diagnosis not present

## 2021-02-21 DIAGNOSIS — D2271 Melanocytic nevi of right lower limb, including hip: Secondary | ICD-10-CM | POA: Diagnosis not present

## 2021-06-01 DIAGNOSIS — J019 Acute sinusitis, unspecified: Secondary | ICD-10-CM | POA: Diagnosis not present

## 2021-06-01 DIAGNOSIS — R051 Acute cough: Secondary | ICD-10-CM | POA: Diagnosis not present

## 2021-06-01 DIAGNOSIS — R000 Tachycardia, unspecified: Secondary | ICD-10-CM | POA: Diagnosis not present

## 2021-06-01 DIAGNOSIS — R509 Fever, unspecified: Secondary | ICD-10-CM | POA: Diagnosis not present

## 2021-06-01 DIAGNOSIS — H6692 Otitis media, unspecified, left ear: Secondary | ICD-10-CM | POA: Diagnosis not present

## 2021-06-01 DIAGNOSIS — J069 Acute upper respiratory infection, unspecified: Secondary | ICD-10-CM | POA: Diagnosis not present

## 2021-06-01 DIAGNOSIS — Z03818 Encounter for observation for suspected exposure to other biological agents ruled out: Secondary | ICD-10-CM | POA: Diagnosis not present

## 2021-06-08 DIAGNOSIS — R059 Cough, unspecified: Secondary | ICD-10-CM | POA: Diagnosis not present

## 2021-07-20 ENCOUNTER — Telehealth: Payer: Self-pay

## 2021-07-20 NOTE — Telephone Encounter (Signed)
Patient's mom, Levada Dy, needs an rx for a handicap Lucianne Lei to go along with the form  you completed for them for the [facility]

## 2021-07-20 NOTE — Telephone Encounter (Signed)
Prescription given to [place] at front desk. Arbie Cookey states that patient's mom requested to pick the prescription up.

## 2021-07-20 NOTE — Telephone Encounter (Signed)
Prescription printed, please fax.

## 2021-11-02 ENCOUNTER — Telehealth: Payer: Self-pay

## 2021-11-02 NOTE — Telephone Encounter (Signed)
Has fax come in yet?  ?

## 2021-11-02 NOTE — Telephone Encounter (Signed)
Copied from [facility] [id number]. Topic: General - Inquiry >> Nov 02, 2021  2:18 PM McGill, Nelva Bush wrote: Reason for CRM: Alfredia Ferguson from [facility]. stated he sent a fax on 10/18/2021. Order for lateral support to fix pt wheel chair.  Lennette Bihari is refaxing information and stated that he needs this back as soon as possible.   Call back - [phone] Fax- [phone]

## 2021-11-14 ENCOUNTER — Telehealth: Payer: Self-pay | Admitting: Family Medicine

## 2021-11-14 ENCOUNTER — Ambulatory Visit: Payer: Self-pay

## 2021-11-14 MED ORDER — CIPROFLOXACIN HCL 0.3 % OP SOLN: 1.0000 [drp] | OPHTHALMIC | 0 refills | Status: AC

## 2021-11-14 NOTE — Telephone Encounter (Signed)
Tried returning Angela's call. Left message to call back. OK for [facility] triage to advise. There are no available appointments today at [facility], but patient can do a virtual visit with [facility] tele[facility]. Please advise patient's mom when she returns call. Dr. Caryn Section has some same day appointments. He can do a virtual visit tomorrow with Dr. Caryn Section also.  ?

## 2021-11-14 NOTE — Telephone Encounter (Signed)
Dr. Caryn Section sent in a prescription. Patient's mom advised.  ?

## 2021-11-14 NOTE — Telephone Encounter (Signed)
? ?  Chief Complaint: R eye discharge ?Symptoms: eye discharge, lid puffy/red ?Frequency: started today ?Pertinent Negatives: Patient denies  fever, runny nose, cough ?Disposition: '[]'$ ED /'[]'$ Urgent Care (no appt availability in office) / '[x]'$ Appointment(In office/virtual)/ '[]'$  [facility]/ '[]'$ Home Care/ '[]'$ Refused Recommended Disposition /'[]'$ [facility]/ '[]'$  Follow-up with PCP ?Additional Notes: Call to office- they requested transfer call for scheduling- mother wants patient seen as soon as possible- virtual requested  ?Reason for Disposition ? [1] Eye with yellow or green discharge, or eyelashes stick together AND [2] NO PCP standing order to call in antibiotic eye drops  (Exception: [place]; continue triage.) ? ?Answer Assessment - Initial Assessment Questions ?1. EYE DISCHARGE: "Is the discharge in one or both eyes?" "What color is it?" "How much is there?" "When did the discharge start?"  ?    Right eye- red with discharge- this morning ?2. REDNESS OF SCLERA: "Is the redness in one or both eyes?" "When did the redness start?"  ?    Just the one ?3. EYELIDS: "Are the eyelids red or swollen?" If Yes, ask: "How much?"  ?    Eye lid puffy/red ?4. VISION: "Is there any difficulty seeing clearly?"  ?    Legally blind ?5. PAIN: "Is there any pain? If Yes, ask: "How bad is it?" (Scale 1-10; or mild, moderate, severe) ?   - MILD (1-3): doesn't interfere with normal activities  ?   - MODERATE (4-7): interferes with normal activities or awakens from sleep ?   - SEVERE (8-10): excruciating pain, unable to do any normal activities   ?    No- feels like something is in the eye ?6. CONTACT LENS: "Do you wear contacts?" ?    no ?7. OTHER SYMPTOMS: "Do you have any other symptoms?" (e.g., fever, runny nose, cough) ?    no ?8. PREGNANCY: "Is there any chance you are pregnant?" "When was your last menstrual period?" ?    *No Answer* ? ?Protocols used: Eye - Pus or [place] ? ?

## 2021-11-14 NOTE — Telephone Encounter (Signed)
Patient's mother called, left VM to return the call to the office to discuss symptoms with a nurse. ? ? ?Summary: pink eye  ? Pt's mom called saying Ngai woke up with Pink eye this morning.  She wants to know if he needs to be seen or can dr. Caryn Section call in something.  ? ?CB# [phone]  ? ?[facility]   ?  ? ?

## 2021-11-14 NOTE — Telephone Encounter (Signed)
Patient Mother called in states patient may have pink eye , and could Dr. Caryn Section call a medication ? ?Please advise  ?

## 2021-11-14 NOTE — Telephone Encounter (Signed)
Patient's mom Levada Dy advised.  ?

## 2021-11-14 NOTE — Telephone Encounter (Signed)
[facility]

## 2022-01-19 DIAGNOSIS — S00451A Superficial foreign body of right ear, initial encounter: Secondary | ICD-10-CM | POA: Diagnosis not present

## 2022-02-21 DIAGNOSIS — L814 Other melanin hyperpigmentation: Secondary | ICD-10-CM | POA: Diagnosis not present

## 2022-02-21 DIAGNOSIS — D229 Melanocytic nevi, unspecified: Secondary | ICD-10-CM | POA: Diagnosis not present

## 2022-02-21 DIAGNOSIS — X32XXXA Exposure to sunlight, initial encounter: Secondary | ICD-10-CM | POA: Diagnosis not present

## 2022-04-20 ENCOUNTER — Ambulatory Visit: Payer: Self-pay | Admitting: *Deleted

## 2022-04-20 NOTE — Telephone Encounter (Signed)
  Chief Complaint: Sore on joint of big toe is sore and looking infected.   Symptoms: red, swollen, painful joint.   Has Cerebral Palsy so is in a wheelchair.   His foot is turned inward.   No injuries. Frequency: Getting worse over the last week. Pertinent Negatives: Patient denies Drainage. Disposition: '[]'$ ED /'[]'$ Urgent Care (no appt availability in office) / '[x]'$ Appointment(In office/virtual)/ '[]'$  [facility]/ '[]'$ Home Care/ '[]'$ Refused Recommended Disposition /'[]'$ [facility]/ '[]'$  Follow-up with PCP Additional Notes: Appt made for 04/21/2022 at 10:00.

## 2022-04-20 NOTE — Progress Notes (Signed)
I,Gregory Villanueva,acting as a Education administrator for [employer], [name].,have documented all relevant documentation on the behalf of Gregory Kirschner, [name],as directed by  Gregory Kirschner, [name] while in the presence of Gregory Kirschner, [name].   Established patient visit   Patient: Gregory Villanueva   DOB: May 16, 1991   31 y.o. Male  MRN: [id number] Visit Date: 04/21/2022  Today's healthcare provider: [name], [name]   Cc. Sore on left big toe x 2 weeks  Subjective    HPI  [name] is a 31 y/o male with CP, wheelchair bound who presents with his parents today. He describes a painful, red area on the joint in his left big toe x 2 weeks. Today the redness is smaller and pain is 5/10 but the last week or so was more red, pain was worse, and the area was draining. He has not worn his AFOs x 2 weeks. D/t CP this area on both feet tends to rub. Yesterday they started using an old antibacterial ointment topically.   Medications: Outpatient Medications Prior to Visit  Medication Sig   Antipyrine-Benzocaine 5.5-1.4 % SOLN Place in ear(s).   No facility-administered medications prior to visit.    Review of Systems  Constitutional:  Negative for fatigue and fever.  Respiratory:  Negative for cough and shortness of breath.   Cardiovascular:  Negative for chest pain, palpitations and leg swelling.  Skin:  Positive for color change, rash and wound.  Neurological:  Negative for dizziness and headaches.     Objective    Blood pressure 129/89, pulse 91, height '5\' 10"'$  (1.778 m), weight 170 lb (77.1 kg), SpO2 100 %.   Physical Exam Vitals reviewed.  Constitutional:      Appearance: He is not ill-appearing.     Comments: Wheelchair bound  HENT:     Head: Normocephalic.  Eyes:     Conjunctiva/sclera: Conjunctivae normal.  Cardiovascular:     Rate and Rhythm: Normal rate.  Pulmonary:     Effort: Pulmonary effort is normal. No respiratory distress.  Feet:     Right foot:     Skin integrity:  Erythema and warmth present.     Left foot:     Skin integrity: Skin breakdown, erythema and warmth present.     Comments: L great toe MTP with prominent bone growth, overlying erythema and open, non draining wound. Slightly warm to touch. No visible pus/drainage/necrotic tissue. Tender. R great toe MTP with warmth and erythema, nontender. Neurological:     General: No focal deficit present.     Mental Status: He is alert and oriented to person, place, and time.  Psychiatric:        Mood and Affect: Mood normal.        Behavior: Behavior normal.      No results found for any visits on 04/21/22.  Assessment & Plan     Problem List Items Addressed This Visit       Musculoskeletal and Integument   Pressure injury of toe of left foot, stage 1 - Primary    Only skin involvement, rx topical bactroban -- pt has appt with podiatry, encouraged them to keep this appt Again will r/o gout but as symmetrical issue likely pressure injury      Relevant Medications   mupirocin cream (BACTROBAN) 2 %     Other   Great toe pain, left    Will run uric acid cbc r/o gout but likely 2/2 constant pressure d/t CP spasticity of  feet      Relevant Orders   CBC w/Diff/Platelet   Comprehensive Metabolic Panel (CMET)   Uric acid   Low HDL (under 40)    Family request lipid check if pt is getting bw anyway--  Previously low hdl       Relevant Orders   Lipid Profile     Return in about 4 months (around 08/21/2022) for CPE w/ pcp.      I, Gregory Kirschner, [name] have reviewed all documentation for this visit. The documentation on  04/21/2022 for the exam, diagnosis, procedures, and orders are all accurate and complete.  Gregory Kirschner, [name] [facility] [address] [place], [place], [address] Office: [phone] Fax: [place]

## 2022-04-20 NOTE — Telephone Encounter (Signed)
Reason for Disposition  [1] Looks infected (spreading redness, pus) AND [2] large red area (> 2 in. or 5 cm)  Answer Assessment - Initial Assessment Questions 1. ONSET: "When did the pain start?"      His big toe has a sore that has opened up.    He has CP and his foot turns in.   It looks infected.   He is in a wheelchair.  2. LOCATION: "Where is the pain located?"      Left foot at the big toe joint.    3. PAIN: "How bad is the pain?"    (Scale 1-10; or mild, moderate, severe)  - MILD (1-3): doesn't interfere with normal activities.   - MODERATE (4-7): interferes with normal activities (e.g., work or school) or awakens from sleep, limping.   - SEVERE (8-10): excruciating pain, unable to do any normal activities, unable to walk.      He doesn't have much feeling in his lower extremities.    4. WORK OR EXERCISE: "Has there been any recent work or exercise that involved this part of the body?"      When I touch it he jumps.   5. CAUSE: "What do you think is causing the foot pain?"     He wears orthotic braces.   We are not sure where the wound came from. 6. OTHER SYMPTOMS: "Do you have any other symptoms?" (e.g., leg pain, rash, fever, numbness)     No 7. PREGNANCY: "Is there any chance you are pregnant?" "When was your last menstrual period?"     N/A  Protocols used: Foot Pain-A-AH

## 2022-04-21 ENCOUNTER — Encounter: Payer: Self-pay | Admitting: Physician Assistant

## 2022-04-21 ENCOUNTER — Ambulatory Visit (INDEPENDENT_AMBULATORY_CARE_PROVIDER_SITE_OTHER): Payer: Medicare Other | Admitting: Physician Assistant

## 2022-04-21 VITALS — BP 129/89 | HR 91 | Ht 70.0 in | Wt 170.0 lb

## 2022-04-21 DIAGNOSIS — E786 Lipoprotein deficiency: Secondary | ICD-10-CM | POA: Insufficient documentation

## 2022-04-21 DIAGNOSIS — M79675 Pain in left toe(s): Secondary | ICD-10-CM

## 2022-04-21 DIAGNOSIS — L89891 Pressure ulcer of other site, stage 1: Secondary | ICD-10-CM

## 2022-04-21 MED ORDER — MUPIROCIN CALCIUM 2 % EX CREA: 1.0000 | TOPICAL_CREAM | Freq: Two times a day (BID) | CUTANEOUS | 0 refills | Status: DC

## 2022-04-21 NOTE — Assessment & Plan Note (Signed)
Family request lipid check if pt is getting bw anyway--  Previously low hdl

## 2022-04-21 NOTE — Assessment & Plan Note (Signed)
Will run uric acid cbc r/o gout but likely 2/2 constant pressure d/t CP spasticity of feet

## 2022-04-21 NOTE — Assessment & Plan Note (Signed)
Only skin involvement, rx topical bactroban -- pt has appt with podiatry, encouraged them to keep this appt Again will r/o gout but as symmetrical issue likely pressure injury

## 2022-04-22 LAB — COMPREHENSIVE METABOLIC PANEL
ALT: 10 IU/L (ref 0–44)
AST: 15 IU/L (ref 0–40)
Albumin/Globulin Ratio: 1.9 (ref 1.2–2.2)
Albumin: 4.6 g/dL (ref 4.1–5.1)
Alkaline Phosphatase: 86 IU/L (ref 44–121)
BUN/Creatinine Ratio: 12 (ref 9–20)
BUN: 8 mg/dL (ref 6–20)
Bilirubin Total: 0.3 mg/dL (ref 0.0–1.2)
CO2: 22 mmol/L (ref 20–29)
Calcium: 9.4 mg/dL (ref 8.7–10.2)
Chloride: 103 mmol/L (ref 96–106)
Creatinine, Ser: 0.69 mg/dL — ABNORMAL LOW (ref 0.76–1.27)
Globulin, Total: 2.4 g/dL (ref 1.5–4.5)
Glucose: 86 mg/dL (ref 70–99)
Potassium: 4.4 mmol/L (ref 3.5–5.2)
Sodium: 142 mmol/L (ref 134–144)
Total Protein: 7 g/dL (ref 6.0–8.5)
eGFR: 126 mL/min/{1.73_m2} (ref >59)

## 2022-04-22 LAB — URIC ACID: Uric Acid: 3.2 mg/dL — ABNORMAL LOW (ref 3.8–8.4)

## 2022-04-22 LAB — LIPID PANEL
Chol/HDL Ratio: 4.5 ratio (ref 0.0–5.0)
Cholesterol, Total: 152 mg/dL (ref 100–199)
HDL: 34 mg/dL — ABNORMAL LOW (ref >39)
LDL Chol Calc (NIH): 85 mg/dL (ref 0–99)
Triglycerides: 191 mg/dL — ABNORMAL HIGH (ref 0–149)
VLDL Cholesterol Cal: 33 mg/dL (ref 5–40)

## 2022-04-22 LAB — CBC WITH DIFFERENTIAL/PLATELET
Basophils Absolute: 0 10*3/uL (ref 0.0–0.2)
Basos: 0 %
EOS (ABSOLUTE): 0.6 10*3/uL — ABNORMAL HIGH (ref 0.0–0.4)
Eos: 10 %
Hematocrit: 44.1 % (ref 37.5–51.0)
Hemoglobin: 14.9 g/dL (ref 13.0–17.7)
Immature Grans (Abs): 0 10*3/uL (ref 0.0–0.1)
Immature Granulocytes: 0 %
Lymphocytes Absolute: 1.8 10*3/uL (ref 0.7–3.1)
Lymphs: 32 %
MCH: 29.9 pg (ref 26.6–33.0)
MCHC: 33.8 g/dL (ref 31.5–35.7)
MCV: 88 fL (ref 79–97)
Monocytes Absolute: 0.4 10*3/uL (ref 0.1–0.9)
Monocytes: 6 %
Neutrophils Absolute: 2.8 10*3/uL (ref 1.4–7.0)
Neutrophils: 52 %
Platelets: 274 10*3/uL (ref 150–450)
RBC: 4.99 x10E6/uL (ref 4.14–5.80)
RDW: 12.4 % (ref 11.6–15.4)
WBC: 5.5 10*3/uL (ref 3.4–10.8)

## 2022-05-02 DIAGNOSIS — L97521 Non-pressure chronic ulcer of other part of left foot limited to breakdown of skin: Secondary | ICD-10-CM | POA: Diagnosis not present

## 2022-05-02 DIAGNOSIS — G800 Spastic quadriplegic cerebral palsy: Secondary | ICD-10-CM | POA: Diagnosis not present

## 2022-06-02 ENCOUNTER — Telehealth: Payer: Self-pay | Admitting: Emergency Medicine

## 2022-06-02 ENCOUNTER — Emergency Department: Payer: Medicare Other

## 2022-06-02 ENCOUNTER — Other Ambulatory Visit: Payer: Self-pay

## 2022-06-02 ENCOUNTER — Emergency Department
Admission: EM | Admit: 2022-06-02 | Discharge: 2022-06-02 | Disposition: A | Discharge status: Home / Self Care | Payer: Medicare Other | Attending: Emergency Medicine | Admitting: Emergency Medicine

## 2022-06-02 DIAGNOSIS — R1031 Right lower quadrant pain: Secondary | ICD-10-CM | POA: Diagnosis present

## 2022-06-02 DIAGNOSIS — D72829 Elevated white blood cell count, unspecified: Secondary | ICD-10-CM | POA: Insufficient documentation

## 2022-06-02 DIAGNOSIS — R1084 Generalized abdominal pain: Secondary | ICD-10-CM | POA: Diagnosis not present

## 2022-06-02 DIAGNOSIS — R1011 Right upper quadrant pain: Secondary | ICD-10-CM | POA: Diagnosis not present

## 2022-06-02 DIAGNOSIS — N201 Calculus of ureter: Secondary | ICD-10-CM | POA: Insufficient documentation

## 2022-06-02 DIAGNOSIS — R110 Nausea: Secondary | ICD-10-CM | POA: Diagnosis not present

## 2022-06-02 DIAGNOSIS — I10 Essential (primary) hypertension: Secondary | ICD-10-CM | POA: Diagnosis not present

## 2022-06-02 DIAGNOSIS — R109 Unspecified abdominal pain: Secondary | ICD-10-CM | POA: Diagnosis not present

## 2022-06-02 DIAGNOSIS — N1330 Unspecified hydronephrosis: Secondary | ICD-10-CM | POA: Diagnosis not present

## 2022-06-02 LAB — COMPREHENSIVE METABOLIC PANEL
ALT: 18 U/L (ref 0–44)
AST: 22 U/L (ref 15–41)
Albumin: 4.2 g/dL (ref 3.5–5.0)
Alkaline Phosphatase: 77 U/L (ref 38–126)
Anion gap: 7 (ref 5–15)
BUN: 11 mg/dL (ref 6–20)
CO2: 23 mmol/L (ref 22–32)
Calcium: 8.7 mg/dL — ABNORMAL LOW (ref 8.9–10.3)
Chloride: 106 mmol/L (ref 98–111)
Creatinine, Ser: 0.78 mg/dL (ref 0.61–1.24)
GFR, Estimated: >60 mL/min (ref >60)
Glucose, Bld: 115 mg/dL — ABNORMAL HIGH (ref 70–99)
Potassium: 3.9 mmol/L (ref 3.5–5.1)
Sodium: 136 mmol/L (ref 135–145)
Total Bilirubin: 0.7 mg/dL (ref 0.3–1.2)
Total Protein: 7.4 g/dL (ref 6.5–8.1)

## 2022-06-02 LAB — CBC WITH DIFFERENTIAL/PLATELET
Abs Immature Granulocytes: 0.08 10*3/uL — ABNORMAL HIGH (ref 0.00–0.07)
Basophils Absolute: 0 10*3/uL (ref 0.0–0.1)
Basophils Relative: 0 %
Eosinophils Absolute: 0.1 10*3/uL (ref 0.0–0.5)
Eosinophils Relative: 1 %
HCT: 43.5 % (ref 39.0–52.0)
Hemoglobin: 13.9 g/dL (ref 13.0–17.0)
Immature Granulocytes: 1 %
Lymphocytes Relative: 6 %
Lymphs Abs: 1 10*3/uL (ref 0.7–4.0)
MCH: 29 pg (ref 26.0–34.0)
MCHC: 32 g/dL (ref 30.0–36.0)
MCV: 90.8 fL (ref 80.0–100.0)
Monocytes Absolute: 0.7 10*3/uL (ref 0.1–1.0)
Monocytes Relative: 4 %
Neutro Abs: 15.2 10*3/uL — ABNORMAL HIGH (ref 1.7–7.7)
Neutrophils Relative %: 88 %
Platelets: 328 10*3/uL (ref 150–400)
RBC: 4.79 MIL/uL (ref 4.22–5.81)
RDW: 13 % (ref 11.5–15.5)
WBC: 17.1 10*3/uL — ABNORMAL HIGH (ref 4.0–10.5)
nRBC: 0 % (ref 0.0–0.2)

## 2022-06-02 LAB — LIPASE, BLOOD: Lipase: 24 U/L (ref 11–51)

## 2022-06-02 MED ORDER — NAPROXEN 500 MG PO TABS: 500.0000 mg | ORAL_TABLET | Freq: Two times a day (BID) | ORAL | 2 refills | Status: DC

## 2022-06-02 MED ORDER — ONDANSETRON 4 MG PO TBDP: 4.0000 mg | ORAL_TABLET | Freq: Three times a day (TID) | ORAL | 0 refills | Status: DC | PRN

## 2022-06-02 MED ORDER — HYDROCODONE-ACETAMINOPHEN 5-325 MG PO TABS: 1.0000 | ORAL_TABLET | Freq: Four times a day (QID) | ORAL | 0 refills | Status: DC | PRN

## 2022-06-02 MED ORDER — IOHEXOL 300 MG/ML  SOLN
100.0000 mL | Freq: Once | INTRAMUSCULAR | Status: AC | PRN
  Administered 2022-06-02: 100 mL via INTRAVENOUS

## 2022-06-02 NOTE — ED Provider Triage Note (Signed)
Emergency Medicine Provider Triage Evaluation Note  Gregory Villanueva , a 31 y.o. male  was evaluated in triage.  Pt complains of abdominal pain that began this morning around 830am this morning. Patient reports it is both RUQ and RLQ, reports it is moving around. EMS gave him 66mg fentanyl with improvement of symptoms. Describes it as cramping. No diarrhea or vomiting. No dysuria or hematuria.  Patient Active Problem List   Diagnosis Date Noted   Pressure injury of toe of left foot, stage 1 04/21/2022   Great toe pain, left 04/21/2022   Low HDL (under 40) 04/21/2022   Dysphagia 003/70/4888  Eosinophilic esophagitis    Cerebral palsy ([place] 07/24/2016   Spastic quadri[place]gic cerebral palsy ([place] 07/24/2016   Impaired mobility 07/24/2016   Spastic quadriplegia ([place] 07/24/2016   Impaired vision 07/24/2016     Review of Systems  Positive: Abdominal pain, nausea Negative: Fever dysuria  Physical Exam  There were no vitals taken for this visit. Gen:   Awake, no distress   Resp:  Normal effort  MSK:   Spastic quadriplegia Other:    Medical Decision Making  Medically screening exam initiated at 11:52 AM.  Appropriate orders placed.  GTERON BLAISwas informed that the remainder of the evaluation will be completed by another provider, this initial triage assessment does not replace that evaluation, and the importance of remaining in the ED until their evaluation is complete.     PMarquette Old [name] 06/02/22 1157

## 2022-06-02 NOTE — ED Provider Notes (Signed)
[facility] Provider Note    Event Date/Time   First MD Initiated Contact with Patient 06/02/22 1218     (approximate)   History   Abdominal pain   HPI  Gregory Villanueva is a 31 y.o. male with history of cerebral palsy who presents with complaints of right-sided abdominal pain.  Patient reports he was feeling well this morning, suddenly had right-sided flank/lower quadrant abdominal pain was severe with nausea.  Family did give pain medication, which did help.  He states that he is feeling improved currently but still has 4-5 out of 10 pain.  No history of abdominal surgery.     Physical Exam   Triage Vital Signs: ED Triage Vitals  Enc Vitals Group     BP 06/02/22 1156 (!) 139/92     Pulse Rate 06/02/22 1156 98     Resp 06/02/22 1156 18     Temp 06/02/22 1156 98 F (36.7 C)     Temp Source 06/02/22 1156 Oral     SpO2 06/02/22 1156 98 %     Weight 06/02/22 1157 77.1 kg (170 lb)     Height 06/02/22 1157 1.753 m ('5\' 9"'$ )     Head Circumference --      Peak Flow --      Pain Score 06/02/22 1156 5     Pain Loc --      Pain Edu? --      Excl. in [place]? --     Most recent vital signs: Vitals:   06/02/22 1156  BP: (!) 139/92  Pulse: 98  Resp: 18  Temp: 98 F (36.7 C)  SpO2: 98%     General: Awake, no distress.  Pleasant interactive CV:  Good peripheral perfusion.  Resp:  Normal effort.  Abd:  No distention.  Minimal right lower quadrant tenderness, no CVA tenderness. Other:     ED Results / Procedures / Treatments   Labs (all labs ordered are listed, but only abnormal results are displayed) Labs Reviewed  COMPREHENSIVE METABOLIC PANEL - Abnormal; Notable for the following components:      Result Value   Glucose, Bld 115 (*)    Calcium 8.7 (*)    All other components within normal limits  CBC WITH DIFFERENTIAL/PLATELET - Abnormal; Notable for the following components:   WBC 17.1 (*)    Neutro Abs 15.2 (*)    Abs Immature Granulocytes  0.08 (*)    All other components within normal limits  LIPASE, BLOOD  URINALYSIS, ROUTINE W REFLEX MICROSCOPIC     EKG     RADIOLOGY CT abdomen/pelvis reviewed interpreted by me, right sided stone noted    PROCEDURES:  Critical Care performed:   Procedures   MEDICATIONS ORDERED IN ED: Medications  iohexol (OMNIPAQUE) 300 MG/ML solution 100 mL (100 mLs Intravenous Contrast Given 06/02/22 1324)     IMPRESSION / MDM / ASSESSMENT AND PLAN / ED COURSE  I reviewed the triage vital signs and the nursing notes. Patient's presentation is most consistent with acute presentation with potential threat to life or bodily function.  Patient presents with abdominal pain as described above.  Differential includes ureterolithiasis, UTI, less likely appendicitis, biliary colic  Lab work notable for elevated white blood cell count of 17, otherwise labs reassuring, pending urinalysis.  Will send for CT abdomen pelvis to evaluate further.  CT scan is c/w stone. Discussed need for urinalysis however it is very difficult for patient to urinate due to CP.  Family and patient opted to avoid urinalysis they understand the risks of this and will return if any fevers/dysuria immediately.  Follow up with Urology, analgesic rx's provided        FINAL CLINICAL IMPRESSION(S) / ED DIAGNOSES   Final diagnoses:  Ureterolithiasis     Rx / DC Orders   ED Discharge Orders          Ordered    naproxen (NAPROSYN) 500 MG tablet  2 times daily with meals        06/02/22 1408    HYDROcodone-acetaminophen (NORCO/VICODIN) 5-325 MG tablet  Every 6 hours PRN        06/02/22 1408    ondansetron (ZOFRAN-ODT) 4 MG disintegrating tablet  Every 8 hours PRN        06/02/22 1408             Note:  This document was prepared using Dragon voice recognition software and may include unintentional dictation errors.   Lavonia Drafts, MD 06/02/22 [id number]

## 2022-06-02 NOTE — ED Notes (Signed)
First Nurse Note: Pt to ED via [facility] from home for severe right upper abdominal pain. Pt has has nausea but no vomiting. Pt was given 4 mg of Zofran and 50 mg Fentanyl. Pt has 20 G IV in the left forearm. Pt hypertensive without hx/o same. Pt mother states that pt is paler than normal.  BP: 154/103 HR: 90 SpO2: 99%

## 2022-06-02 NOTE — Telephone Encounter (Signed)
[facility] did not have med

## 2022-06-02 NOTE — ED Triage Notes (Addendum)
Pt. To ED via EMS for sharp, sudden onset RLQ abdominal pain with nausea and heaving since 0800.  Pt. Received zofran and '25mg'$  fentanyl x2 IV from EMS.

## 2022-06-03 ENCOUNTER — Telehealth: Payer: Self-pay | Admitting: Emergency Medicine

## 2022-06-03 MED ORDER — HYDROCODONE-ACETAMINOPHEN 5-325 MG PO TABS: 1.0000 | ORAL_TABLET | Freq: Four times a day (QID) | ORAL | 0 refills | Status: AC | PRN

## 2022-06-03 NOTE — Telephone Encounter (Signed)
Apparently there was a mixup with the prescription being sent to the wrong pharmacy.  I sent a new prescription for hydrocodone to the correct pharmacy, [facility] on [address]

## 2022-06-06 ENCOUNTER — Ambulatory Visit
Admission: RE | Admit: 2022-06-06 | Discharge: 2022-06-06 | Disposition: A | Discharge status: Home / Self Care | Payer: Medicare Other | Source: Ambulatory Visit | Attending: Urology | Admitting: Urology

## 2022-06-06 ENCOUNTER — Other Ambulatory Visit: Payer: Self-pay | Admitting: Urology

## 2022-06-06 ENCOUNTER — Ambulatory Visit
Admission: RE | Admit: 2022-06-06 | Discharge: 2022-06-06 | Disposition: A | Discharge status: Home / Self Care | Payer: Medicare Other | Attending: Urology | Admitting: Urology

## 2022-06-06 ENCOUNTER — Ambulatory Visit: Payer: Medicare Other | Admitting: Urology

## 2022-06-06 ENCOUNTER — Ambulatory Visit (INDEPENDENT_AMBULATORY_CARE_PROVIDER_SITE_OTHER): Payer: Medicare Other | Admitting: Urology

## 2022-06-06 ENCOUNTER — Other Ambulatory Visit: Payer: Self-pay | Admitting: *Deleted

## 2022-06-06 ENCOUNTER — Encounter: Payer: Self-pay | Admitting: Urology

## 2022-06-06 VITALS — BP 136/83 | HR 114 | Ht 69.0 in | Wt 170.0 lb

## 2022-06-06 DIAGNOSIS — N200 Calculus of kidney: Secondary | ICD-10-CM

## 2022-06-06 DIAGNOSIS — Z87442 Personal history of urinary calculi: Secondary | ICD-10-CM | POA: Diagnosis not present

## 2022-06-06 DIAGNOSIS — N201 Calculus of ureter: Secondary | ICD-10-CM

## 2022-06-06 DIAGNOSIS — M419 Scoliosis, unspecified: Secondary | ICD-10-CM | POA: Diagnosis not present

## 2022-06-06 DIAGNOSIS — I878 Other specified disorders of veins: Secondary | ICD-10-CM | POA: Diagnosis not present

## 2022-06-06 MED ORDER — TAMSULOSIN HCL 0.4 MG PO CAPS: 0.4000 mg | ORAL_CAPSULE | Freq: Every day | ORAL | 0 refills | Status: DC

## 2022-06-06 NOTE — Progress Notes (Signed)
ESWL ORDER FORM  Expected date of procedure: 06/08/2022  Surgeon: John Giovanni, MD  Post op standing: 2-4wk follow up w/KUB prior  Anticoagulation/Aspirin/NSAID standing order: Okay to continue(distal stone)  Anesthesia standing order: MAC  VTE standing: SCD's  Dx: Right Ureteral Stone  Procedure: right Extracorporeal shock wave lithotripsy  CPT : 47340  Standing Order Set:   *NPO after mn, KUB  *NS 1109m/hr, Keflex 504mPO, Benadryl 2565mO, Valium 55m87m, Zofran 4mg 72m   Medications if other than standing orders:   NONE

## 2022-06-06 NOTE — Progress Notes (Signed)
06/06/22 3:24 PM   [name] 04-Aug-1991 [id number]  CC: Right ureteral stone  HPI: 31 year old male with cerebral palsy here with his parents today.  They help provide most of the history.  He has had about a week of intermittent severe right-sided flank and lower quadrant pain, and was seen in the ER on 06/02/2022 where CT showed a 7 mm right proximal ureteral stone with hydronephrosis.  He only voids 2-3 times per day, and so urinalysis was not obtained, but he denies any fevers, chills, or dysuria.  No prior UTIs.   PMH: Past Medical History:  Diagnosis Date   Cerebral palsy ([place])    CP (cerebral palsy), spastic, quadriplegic ([place])    History of chickenpox     Surgical History: Past Surgical History:  Procedure Laterality Date   Dorsal Rhinoplasty  1998   ESOPHAGOGASTRODUODENOSCOPY (EGD) WITH PROPOFOL N/A 04/17/2018   Procedure: ESOPHAGOGASTRODUODENOSCOPY (EGD) WITH PROPOFOL;  Surgeon: Lin Landsman, MD;  Location: [facility] ENDOSCOPY;  Service: Gastroenterology;  Laterality: N/A;   Various muscle and Tendon Surgeries     1992-2000   Family History: Family History  Problem Relation Age of Onset   Pancreatic cancer Father 52   Hypertension Maternal Grandmother    Hypertension Maternal Grandfather    Prostate cancer Neg Hx    Colon cancer Neg Hx     Social History:  reports that he has never smoked. He has never been exposed to tobacco smoke. He has never used smokeless tobacco. He reports that he does not drink alcohol and does not use drugs.  Physical Exam: BP 136/83   Pulse (!) 114   Ht '5\' 9"'$  (1.753 m)   Wt 170 lb (77.1 kg)   BMI 25.10 kg/m    Constitutional:  Alert and oriented, No acute distress. Cardiovascular: No clubbing, cyanosis, or edema. Respiratory: Normal respiratory effort, no increased work of breathing. GI: Abdomen is soft, nontender, nondistended, no abdominal masses  Laboratory Data: Reviewed  Pertinent Imaging: I have personally  viewed and interpreted the ET showing a 7 mm right proximal ureteral stone, no other renal stones.  KUB today shows migration of the stone to the right distal ureter.  Assessment & Plan:   31 year old male with 7 mm right ureteral stone, some migration to the right distal ureter over the last few days, but ongoing flank pain.  No clinical evidence of infection, UA unable to be obtained as patient only voids a few times daily and would like to avoid straight cath.  We discussed various treatment options for urolithiasis including observation with or without medical expulsive therapy, shockwave lithotripsy (SWL), ureteroscopy and laser lithotripsy with stent placement, and percutaneous nephrolithotomy.  We discussed that management is based on stone size, location, density, patient co-morbidities, and patient preference.   Stones <31m in size have a >80% spontaneous passage rate. Data surrounding the use of tamsulosin for medical expulsive therapy is controversial, but meta analyses suggests it is most efficacious for distal stones between 5-120min size. Possible side effects include dizziness/lightheadedness, and retrograde ejaculation.  SWL has a lower stone free rate in a single procedure, but also a lower complication rate compared to ureteroscopy and avoids a stent and associated stent related symptoms. Possible complications include renal hematoma, steinstrasse, and need for additional treatment.  Ureteroscopy with laser lithotripsy and stent placement has a higher stone free rate than SWL in a single procedure, however increased complication rate including possible infection, ureteral injury, bleeding, and stent related  morbidity. Common stent related symptoms include dysuria, urgency/frequency, and flank pain.  I think a trial of medical expulsive therapy, shockwave lithotripsy, or ureteroscopy are all reasonable options.  Per his parents he really has been quite uncomfortable over the last few  days and they are more interested in pursuing intervention.  Using shared decision making, they opted for shockwave lithotripsy this week, and risks and benefits were discussed extensively  I spent 65 total minutes on the day of the encounter including pre-visit review of the medical record, face-to-face time with the patient, and post visit ordering of labs/imaging/tests.   Nickolas Madrid, MD 06/06/2022  [facility] [address] [place], [place] [address] [phone]

## 2022-06-06 NOTE — H&P (View-Only) (Signed)
06/06/22 3:24 PM   Gregory Villanueva 1991/04/24 [id number]  CC: Right ureteral stone  HPI: 31 year old male with cerebral palsy here with his parents today.  They help provide most of the history.  He has had about a week of intermittent severe right-sided flank and lower quadrant pain, and was seen in the ER on 06/02/2022 where CT showed a 7 mm right proximal ureteral stone with hydronephrosis.  He only voids 2-3 times per day, and so urinalysis was not obtained, but he denies any fevers, chills, or dysuria.  No prior UTIs.   PMH: Past Medical History:  Diagnosis Date   Cerebral palsy ([place])    CP (cerebral palsy), spastic, quadriplegic ([place])    History of chickenpox     Surgical History: Past Surgical History:  Procedure Laterality Date   Dorsal Rhinoplasty  1998   ESOPHAGOGASTRODUODENOSCOPY (EGD) WITH PROPOFOL N/A 04/17/2018   Procedure: ESOPHAGOGASTRODUODENOSCOPY (EGD) WITH PROPOFOL;  Surgeon: Lin Landsman, MD;  Location: [facility] ENDOSCOPY;  Service: Gastroenterology;  Laterality: N/A;   Various muscle and Tendon Surgeries     1992-2000   Family History: Family History  Problem Relation Age of Onset   Pancreatic cancer Father 35   Hypertension Maternal Grandmother    Hypertension Maternal Grandfather    Prostate cancer Neg Hx    Colon cancer Neg Hx     Social History:  reports that he has never smoked. He has never been exposed to tobacco smoke. He has never used smokeless tobacco. He reports that he does not drink alcohol and does not use drugs.  Physical Exam: BP 136/83   Pulse (!) 114   Ht '5\' 9"'$  (1.753 m)   Wt 170 lb (77.1 kg)   BMI 25.10 kg/m    Constitutional:  Alert and oriented, No acute distress. Cardiovascular: No clubbing, cyanosis, or edema. Respiratory: Normal respiratory effort, no increased work of breathing. GI: Abdomen is soft, nontender, nondistended, no abdominal masses  Laboratory Data: Reviewed  Pertinent Imaging: I have personally  viewed and interpreted the ET showing a 7 mm right proximal ureteral stone, no other renal stones.  KUB today shows migration of the stone to the right distal ureter.  Assessment & Plan:   31 year old male with 7 mm right ureteral stone, some migration to the right distal ureter over the last few days, but ongoing flank pain.  No clinical evidence of infection, UA unable to be obtained as patient only voids a few times daily and would like to avoid straight cath.  We discussed various treatment options for urolithiasis including observation with or without medical expulsive therapy, shockwave lithotripsy (SWL), ureteroscopy and laser lithotripsy with stent placement, and percutaneous nephrolithotomy.  We discussed that management is based on stone size, location, density, patient co-morbidities, and patient preference.   Stones <64m in size have a >80% spontaneous passage rate. Data surrounding the use of tamsulosin for medical expulsive therapy is controversial, but meta analyses suggests it is most efficacious for distal stones between 5-139min size. Possible side effects include dizziness/lightheadedness, and retrograde ejaculation.  SWL has a lower stone free rate in a single procedure, but also a lower complication rate compared to ureteroscopy and avoids a stent and associated stent related symptoms. Possible complications include renal hematoma, steinstrasse, and need for additional treatment.  Ureteroscopy with laser lithotripsy and stent placement has a higher stone free rate than SWL in a single procedure, however increased complication rate including possible infection, ureteral injury, bleeding, and stent related  morbidity. Common stent related symptoms include dysuria, urgency/frequency, and flank pain.  I think a trial of medical expulsive therapy, shockwave lithotripsy, or ureteroscopy are all reasonable options.  Per his parents he really has been quite uncomfortable over the last few  days and they are more interested in pursuing intervention.  Using shared decision making, they opted for shockwave lithotripsy this week, and risks and benefits were discussed extensively  I spent 65 total minutes on the day of the encounter including pre-visit review of the medical record, face-to-face time with the patient, and post visit ordering of labs/imaging/tests.   Nickolas Madrid, MD 06/06/2022  [facility] [address] [place], [place] [address] [phone]

## 2022-06-06 NOTE — Patient Instructions (Signed)
Lithotripsy  Lithotripsy is a treatment that can help break up kidney stones that are too large to pass on their own. This is a nonsurgical procedure that crushes a kidney stone with shock waves. These shock waves pass through your body and focus on the kidney stone. They cause the kidney stone to break up into smaller pieces while it is still in the urinary tract. The smaller pieces of stone can pass more easily out of your body in the urine. Tell a health care provider about: Any allergies you have. All medicines you are taking, including vitamins, herbs, eye drops, creams, and over-the-counter medicines. Any problems you or family members have had with anesthetic medicines. Any blood disorders you have. Any surgeries you have had. Any medical conditions you have. Whether you are pregnant or may be pregnant. What are the risks? Generally, this is a safe procedure. However, problems may occur, including: Infection. Bleeding from the kidney. Bruising of the kidney or skin. Scarring of the kidney, which can lead to: Increased blood pressure. Poor kidney function. Return (recurrence) of kidney stones. Damage to other structures or organs, such as the liver, colon, spleen, or pancreas. Blockage (obstruction) of the tube that carries urine from the kidney to the bladder (ureter). Failure of the kidney stone to break into pieces (fragments). What happens before the procedure? Staying hydrated Follow instructions from your health care provider about hydration, which may include: Up to 2 hours before the procedure - you may continue to drink clear liquids, such as water, clear fruit juice, black coffee, and plain tea. Eating and drinking restrictions Follow instructions from your health care provider about eating and drinking, which may include: 8 hours before the procedure - stop eating heavy meals or foods, such as meat, fried foods, or fatty foods. 6 hours before the procedure - stop eating  light meals or foods, such as toast or cereal. 6 hours before the procedure - stop drinking milk or drinks that contain milk. 2 hours before the procedure - stop drinking clear liquids. Medicines Ask your health care provider about: Changing or stopping your regular medicines. This is especially important if you are taking diabetes medicines or blood thinners. Taking medicines such as aspirin and ibuprofen. These medicines can thin your blood. Do not take these medicines unless your health care provider tells you to take them. Taking over-the-counter medicines, vitamins, herbs, and supplements. Tests You may have tests, such as: Blood tests. Urine tests. Imaging tests, such as a CT scan. General instructions Plan to have someone take you home from the hospital or clinic. If you will be going home right after the procedure, plan to have someone with you for 24 hours. Ask your health care provider what steps will be taken to help prevent infection. These may include washing skin with a germ-killing soap. What happens during the procedure?  An IV will be inserted into one of your veins. You will be given one or more of the following: A medicine to help you relax (sedative). A medicine to make you fall asleep (general anesthetic). A water-filled cushion may be placed behind your kidney or on your abdomen. In some cases, you may be placed in a tub of lukewarm water. Your body will be positioned in a way that makes it easy to target the kidney stone. An X-ray or ultrasound exam will be done to locate your stone. Shock waves will be aimed at the stone. If you are awake, you may feel a tapping sensation  as the shock waves pass through your body. A flexible tube with holes in it (stent) may be placed in the ureter. This will help keep urine flowing from the kidney if the fragments of the stone have been blocking the ureter. The procedure may vary among health care providers and hospitals. What  happens after the procedure? You may have an X-ray to see whether the procedure was able to break up the kidney stone and how much of the stone has passed. If large stone fragments remain after treatment, you may need to have a second procedure at a later time. Your blood pressure, heart rate, breathing rate, and blood oxygen level will be monitored until you leave the hospital or clinic. You may be given antibiotics or pain medicine as needed. If a stent was placed in your ureter during surgery, it may stay in place for a few weeks. You may need to strain your urine to collect pieces of the kidney stone for testing. You will need to drink plenty of water. If you were given a sedative during the procedure, it can affect you for several hours. Do not drive or operate machinery until your health care provider says that it is safe. Summary Lithotripsy is a treatment that can help break up kidney stones that are too large to pass on their own. Lithotripsy is a nonsurgical procedure that crushes a kidney stone with shock waves. Generally, this is a safe procedure. However, problems may occur, including damage to the kidney or other organs, infection, or obstruction of the tube that carries urine from the kidney to the bladder (ureter). You may have a stent placed in your ureter to help drain your urine. This stent may stay in place for a few weeks. After the procedure, you will need to drink plenty of water. You may be asked to strain your urine to collect pieces of the kidney stone for testing. This information is not intended to replace advice given to you by your health care provider. Make sure you discuss any questions you have with your health care provider. Document Revised: 06/27/2021 Document Reviewed: 04/04/2021 Elsevier Patient Education  [place] [employer] This sheet gives you information about how to care for yourself after your procedure. Your health care provider  may also give you more specific instructions. If you have problems or questions, contact your health care provider. What can I expect after the procedure? After the procedure, it is common to have: Some blood in your urine. This should only last for a few days. Soreness in your back, sides, or upper abdomen for a few days. Blotches or bruises on the area where the shock wave entered the skin. Pain, discomfort, or nausea when pieces (fragments) of the kidney stone move through the tube that carries urine from the kidney to the bladder (ureter). Stone fragments may pass soon after the procedure, but they may continue to pass for up to 4-8 weeks. If you have severe pain or nausea, contact your health care provider. This may be caused by a large stone that was not broken up, and this may mean that you need more treatment. Some pain or discomfort during urination. Some pain or discomfort in the lower abdomen or (in men) at the base of the penis. Follow these instructions at home: Medicines Take over-the-counter and prescription medicines only as told by your health care provider. If you were prescribed an antibiotic medicine, take it as told by your health care  provider. Do not stop taking the antibiotic even if you start to feel better. Ask your health care provider if the medicine prescribed to you requires you to avoid driving or using machinery. Eating and drinking     Drink enough fluid to keep your urine pale yellow. This helps any remaining pieces of the stone to pass. It can also help prevent new stones from forming. Eat plenty of fresh fruits and vegetables. Follow instructions from your health care provider about eating or drinking restrictions. You may be instructed to: Reduce how much salt (sodium) you eat or drink. Check ingredients and nutrition facts on packaged foods and beverages to see how much sodium they contain. Reduce how much meat you eat. Eat the recommended amount of  calcium for your age and gender. Ask your health care provider how much calcium you should have. General instructions Get plenty of rest. Return to your normal activities as told by your health care provider. Ask your health care provider what activities are safe for you. Most people can resume normal activities 1-2 days after the procedure. If you were given a sedative during the procedure, it can affect you for several hours. Do not drive or operate machinery until your health care provider says that it is safe. Your health care provider may direct you to lie in a certain position (postural drainage) and tap firmly (percuss) over your kidney area to help stone fragments pass. Follow instructions as told by your health care provider. If directed, strain all urine through the strainer that was provided by your health care provider. Keep all fragments for your health care provider to see. Any stones that are found may be sent to a medical lab for examination. The stone may be as small as a grain of salt. Keep all follow-up visits as told by your health care provider. This is important. Contact a health care provider if: You have a fever or chills. You have nausea that is severe or does not go away. You have any of these urinary symptoms: Blood in your urine for longer than your health care provider told you to expect. Urine that smells bad or unusual. Feeling a strong urge to urinate after emptying your bladder. Pain or burning with urination that does not go away. Urinating more often than usual and this does not go away. You have a stent and it comes out. Get help right away if: You have severe pain in your back, sides, or upper abdomen. You have any of these urinary symptoms: Severe pain while urinating. More blood in your urine or having blood in your urine when you did not before. Passing blood clots in your urine. Passing only a small amount of urine or being unable to pass any urine at  all. You have severe nausea that leads to persistent vomiting. You faint. Summary After this procedure, it is common to have some pain, discomfort, or nausea when pieces (fragments) of the kidney stone move through the tube that carries urine from the kidney to the bladder (ureter). If this pain or nausea is severe, however, you should contact your health care provider. Return to your normal activities as told by your health care provider. Ask your health care provider what activities are safe for you. Drink enough fluid to keep your urine pale yellow. This helps any remaining pieces of the stone to pass, and it can help prevent new stones from forming. If directed, strain your urine and keep all fragments for your  health care provider to see. Fragments or stones may be as small as a grain of salt. Get help right away if you have severe pain in your back, sides, or upper abdomen, or if you have severe pain while urinating. This information is not intended to replace advice given to you by your health care provider. Make sure you discuss any questions you have with your health care provider. Document Revised: 06/27/2021 Document Reviewed: 04/04/2021 Elsevier Patient Education  [place].

## 2022-06-08 ENCOUNTER — Encounter
Admission: RE | Disposition: A | Discharge status: Home / Self Care | Payer: Self-pay | Source: Ambulatory Visit | Attending: Urology

## 2022-06-08 ENCOUNTER — Encounter: Payer: Self-pay | Admitting: Urology

## 2022-06-08 ENCOUNTER — Ambulatory Visit
Admission: RE | Admit: 2022-06-08 | Discharge: 2022-06-08 | Disposition: A | Discharge status: Home / Self Care | Payer: Medicare Other | Source: Ambulatory Visit | Attending: Urology | Admitting: Urology

## 2022-06-08 ENCOUNTER — Other Ambulatory Visit: Payer: Self-pay

## 2022-06-08 ENCOUNTER — Ambulatory Visit: Payer: Medicare Other

## 2022-06-08 DIAGNOSIS — M419 Scoliosis, unspecified: Secondary | ICD-10-CM | POA: Diagnosis not present

## 2022-06-08 DIAGNOSIS — N201 Calculus of ureter: Secondary | ICD-10-CM | POA: Insufficient documentation

## 2022-06-08 DIAGNOSIS — G800 Spastic quadriplegic cerebral palsy: Secondary | ICD-10-CM | POA: Diagnosis not present

## 2022-06-08 DIAGNOSIS — I878 Other specified disorders of veins: Secondary | ICD-10-CM | POA: Diagnosis not present

## 2022-06-08 SURGERY — LITHOTRIPSY, ESWL
Anesthesia: Moderate Sedation | Laterality: Right

## 2022-06-08 MED ORDER — ONDANSETRON HCL 4 MG/2ML IJ SOLN
INTRAMUSCULAR | Status: AC
  Administered 2022-06-08: 4 mg via INTRAVENOUS
  Filled 2022-06-08: qty 2

## 2022-06-08 MED ORDER — CEPHALEXIN 500 MG PO CAPS: 500.0000 mg | ORAL_CAPSULE | Freq: Once | ORAL | Status: AC

## 2022-06-08 MED ORDER — DIPHENHYDRAMINE HCL 25 MG PO CAPS: 25.0000 mg | ORAL_CAPSULE | ORAL | Status: AC

## 2022-06-08 MED ORDER — DIAZEPAM 5 MG PO TABS
ORAL_TABLET | ORAL | Status: AC
  Administered 2022-06-08: 10 mg via ORAL
  Filled 2022-06-08: qty 2

## 2022-06-08 MED ORDER — DIPHENHYDRAMINE HCL 25 MG PO CAPS
ORAL_CAPSULE | ORAL | Status: AC
  Administered 2022-06-08: 25 mg via ORAL
  Filled 2022-06-08: qty 1

## 2022-06-08 MED ORDER — CEPHALEXIN 500 MG PO CAPS
ORAL_CAPSULE | ORAL | Status: AC
  Administered 2022-06-08: 500 mg via ORAL
  Filled 2022-06-08: qty 1

## 2022-06-08 MED ORDER — DIAZEPAM 5 MG PO TABS: 10.0000 mg | ORAL_TABLET | ORAL | Status: AC

## 2022-06-08 MED ORDER — SODIUM CHLORIDE 0.9 % IV SOLN: INTRAVENOUS | Status: DC

## 2022-06-08 MED ORDER — ONDANSETRON HCL 4 MG/2ML IJ SOLN: 4.0000 mg | Freq: Once | INTRAMUSCULAR | Status: AC

## 2022-06-08 NOTE — Discharge Instructions (Addendum)
AMBULATORY SURGERY  DISCHARGE INSTRUCTIONS   The drugs that you were given will stay in your system until tomorrow so for the next 24 hours you should not:  Drive an automobile Make any legal decisions Drink any alcoholic beverage   You may resume regular meals tomorrow.  Today it is better to start with liquids and gradually work up to solid foods.  You may eat anything you prefer, but it is better to start with liquids, then soup and crackers, and gradually work up to solid foods.   Please notify your doctor immediately if you have any unusual bleeding, trouble breathing, redness and pain at the surgery site, drainage, fever, or pain not relieved by medication.    Additional Instructions:   As per the [facility] discharge instructions Continue pain medication as needed Continue tamsulosin which will help you pass stone fragments  Call [facility] at [phone] for pain not controlled with oral medications or fever greater than 101 degrees You have a follow-up appointment scheduled 06/28/2022      Please contact your physician with any problems or Same Day Surgery at [phone], Monday through Friday 6 am to 4 pm, or [place] at [facility] number at [phone].

## 2022-06-08 NOTE — Interval H&P Note (Signed)
History and Physical Interval Note:  06/08/2022 12:36 PM  Gregory Villanueva  has presented today for surgery, with the diagnosis of Right Ureteral Stone.  The various methods of treatment have been discussed with the patient and family. After consideration of risks, benefits and other options for treatment, the patient has consented to  Procedure(s): EXTRACORPOREAL SHOCK WAVE LITHOTRIPSY (ESWL) (Right) as a surgical intervention.  The patient's history has been reviewed, patient examined, no change in status, stable for surgery.  I have reviewed the patient's chart and labs.  Questions were answered to the patient's satisfaction.     [place]

## 2022-06-28 ENCOUNTER — Ambulatory Visit
Admission: RE | Admit: 2022-06-28 | Discharge: 2022-06-28 | Disposition: A | Discharge status: Home / Self Care | Payer: Medicare Other | Source: Ambulatory Visit | Attending: Urology | Admitting: Urology

## 2022-06-28 ENCOUNTER — Ambulatory Visit: Payer: Medicare Other | Admitting: Physician Assistant

## 2022-06-28 ENCOUNTER — Ambulatory Visit (INDEPENDENT_AMBULATORY_CARE_PROVIDER_SITE_OTHER): Payer: Medicare Other | Admitting: Physician Assistant

## 2022-06-28 VITALS — BP 150/92 | HR 134 | Wt 170.0 lb

## 2022-06-28 DIAGNOSIS — N201 Calculus of ureter: Secondary | ICD-10-CM | POA: Insufficient documentation

## 2022-06-28 DIAGNOSIS — M419 Scoliosis, unspecified: Secondary | ICD-10-CM | POA: Diagnosis not present

## 2022-06-28 DIAGNOSIS — I878 Other specified disorders of veins: Secondary | ICD-10-CM | POA: Diagnosis not present

## 2022-06-28 DIAGNOSIS — Z87442 Personal history of urinary calculi: Secondary | ICD-10-CM | POA: Diagnosis not present

## 2022-06-28 NOTE — Progress Notes (Signed)
06/28/2022 4:36 PM   Gregory Villanueva 08/28/1990 [id number]  CC: Chief Complaint  Patient presents with   kindey stones   HPI: Gregory Villanueva is a 31 y.o. male with PMH spastic CP who underwent ESWL with Dr. Bernardo Heater 20 days ago for management of a 7 mm distal right ureteral stone who presents today for postop follow-up.  Operative note describes smudging of the stone.  He is accompanied today by his parents, who contribute to HPI.  Today he reports he passed 3 stone fragments 2 days after undergoing ESWL.  He was not able to capture these for analysis.  He has not passed any additional fragments that he is aware of.  His pain has resolved and he has no acute concerns today.  He denies dysuria or gross hematuria.  KUB today with interval clearance of the distal right ureteral stone, however there is some bowel contents overlying the anticipated path of the distal ureter that may be obscuring the view.  They report poor p.o. hydration and he used to drink a lot of tea, though he has been cutting back since diagnosed with his recent stone episode.  Patient unable to provide a urine sample today.  PMH: Past Medical History:  Diagnosis Date   Cerebral palsy ([place])    CP (cerebral palsy), spastic, quadriplegic ([place])    History of chickenpox     Surgical History: Past Surgical History:  Procedure Laterality Date   Dorsal Rhinoplasty  1998   ESOPHAGOGASTRODUODENOSCOPY (EGD) WITH PROPOFOL N/A 04/17/2018   Procedure: ESOPHAGOGASTRODUODENOSCOPY (EGD) WITH PROPOFOL;  Surgeon: Lin Landsman, MD;  Location: [facility] ENDOSCOPY;  Service: Gastroenterology;  Laterality: N/A;   Various muscle and Tendon Surgeries     1992-2000    Home Medications:  Allergies as of 06/28/2022   No Known Allergies      Medication List        Accurate as of June 28, 2022  4:36 PM. If you have any questions, ask your nurse or doctor.          STOP taking these medications    tamsulosin 0.4 MG  Caps capsule Commonly known as: FLOMAX       TAKE these medications    acetaminophen 500 MG tablet Commonly known as: TYLENOL Take by mouth.   bisacodyl 10 MG suppository Commonly known as: DULCOLAX Place rectally.   naproxen 500 MG tablet Commonly known as: Naprosyn Take 1 tablet (500 mg total) by mouth 2 (two) times daily with a meal.   ondansetron 4 MG disintegrating tablet Commonly known as: ZOFRAN-ODT Take 1 tablet (4 mg total) by mouth every 8 (eight) hours as needed for nausea or vomiting.        Allergies:  No Known Allergies  Family History: Family History  Problem Relation Age of Onset   Pancreatic cancer Father 12   Hypertension Maternal Grandmother    Hypertension Maternal Grandfather    Prostate cancer Neg Hx    Colon cancer Neg Hx     Social History:   reports that he has never smoked. He has never been exposed to tobacco smoke. He has never used smokeless tobacco. He reports that he does not drink alcohol and does not use drugs.  Physical Exam: BP (!) 150/92   Pulse (!) 134   Wt 170 lb (77.1 kg)   BMI 25.10 kg/m   Constitutional:  Alert and oriented, no acute distress, nontoxic appearing HEENT: Gregory Villanueva, AT Cardiovascular: No clubbing, cyanosis, or edema  Respiratory: Normal respiratory effort, no increased work of breathing Skin: No rashes, bruises or suspicious lesions Neurologic: Spastic CP Psychiatric: Normal mood and affect  Pertinent Imaging: KUB, 06/28/2022: CLINICAL DATA:  History of ureteral stone.   EXAM: ABDOMEN - 1 VIEW   COMPARISON:  KUB 06/08/2022 and 06/06/2022. CT abdomen and pelvis 06/02/2022.   FINDINGS: Previously seen distal right ureteral stone is no longer visualized. Small phlebolith in the right pelvis again seen. No evidence of urinary tract stone is identified. Large stool ball in the rectosigmoid colon noted. There is convex left scoliosis.   IMPRESSION: Previously seen distal right ureteral stone is no  longer visualized consistent with passage. No acute finding.   Large volume of stool in the rectosigmoid colon.   Convex left scoliosis.     Electronically Signed   By: Inge Rise M.D.   On: 06/30/2022 10:07  I personally reviewed the images referenced above and note interval resolution of the distal right ureteral stone.  Assessment & Plan:   1. Right ureteral stone Fragments pass, though not captured for analysis.  His pain has resolved.  With suboptimal KUB today, we discussed the possibility of small retained fragment and I offered him repeat imaging, but they declined this.  We discussed return precautions including recurrent flank pain or dysuria or hematuria.  I counseled the patient on general stone prevention techniques including increasing water intake with a goal of producing 2.5L of urine daily, decreasing intake of high oxalate-containing foods, increasing citric acid intake, maintaining moderate dietary calcium intake, and decreasing salt intake.  Written and verbal guidance provided today.   Return in about 1 year (around 06/29/2023) for Annual stone visit with KUB prior.  Debroah Loop, [name]  [facility] [address] [place], [place] [address] [phone]

## 2023-02-23 DIAGNOSIS — R208 Other disturbances of skin sensation: Secondary | ICD-10-CM | POA: Diagnosis not present

## 2023-02-23 DIAGNOSIS — B078 Other viral warts: Secondary | ICD-10-CM | POA: Diagnosis not present

## 2023-02-23 DIAGNOSIS — D229 Melanocytic nevi, unspecified: Secondary | ICD-10-CM | POA: Diagnosis not present

## 2023-04-02 DIAGNOSIS — R0683 Snoring: Secondary | ICD-10-CM | POA: Diagnosis not present

## 2023-04-02 DIAGNOSIS — G800 Spastic quadriplegic cerebral palsy: Secondary | ICD-10-CM | POA: Diagnosis not present

## 2023-04-02 DIAGNOSIS — M545 Low back pain, unspecified: Secondary | ICD-10-CM | POA: Diagnosis not present

## 2023-04-02 DIAGNOSIS — R1310 Dysphagia, unspecified: Secondary | ICD-10-CM | POA: Diagnosis not present

## 2023-04-02 DIAGNOSIS — G8929 Other chronic pain: Secondary | ICD-10-CM | POA: Diagnosis not present

## 2023-04-02 DIAGNOSIS — M4145 Neuromuscular scoliosis, thoracolumbar region: Secondary | ICD-10-CM | POA: Diagnosis not present

## 2023-04-06 DIAGNOSIS — M4145 Neuromuscular scoliosis, thoracolumbar region: Secondary | ICD-10-CM | POA: Diagnosis not present

## 2023-04-06 DIAGNOSIS — G800 Spastic quadriplegic cerebral palsy: Secondary | ICD-10-CM | POA: Diagnosis not present

## 2023-04-06 DIAGNOSIS — M545 Low back pain, unspecified: Secondary | ICD-10-CM | POA: Diagnosis not present

## 2023-04-06 DIAGNOSIS — G8929 Other chronic pain: Secondary | ICD-10-CM | POA: Diagnosis not present

## 2023-04-18 ENCOUNTER — Ambulatory Visit: Payer: Medicare Other | Attending: Physical Medicine and Rehabilitation | Admitting: Speech Pathology

## 2023-04-18 DIAGNOSIS — R1310 Dysphagia, unspecified: Secondary | ICD-10-CM | POA: Insufficient documentation

## 2023-04-18 DIAGNOSIS — R1312 Dysphagia, oropharyngeal phase: Secondary | ICD-10-CM | POA: Insufficient documentation

## 2023-04-18 DIAGNOSIS — G800 Spastic quadriplegic cerebral palsy: Secondary | ICD-10-CM | POA: Diagnosis not present

## 2023-04-18 NOTE — Therapy (Signed)
OUTPATIENT SPEECH LANGUAGE PATHOLOGY  CLINICAL SWALLOW EVALUATION ONLY   Patient Name: Gregory Villanueva MRN: [id number] DOB:[birth year], 32 y.o., male Today's Date: 04/19/2023  PCP: Mila Merry, MD REFERRING PROVIDER: Windle Guard, MD   End of Session - 04/19/23 1400     Visit Number 1    Number of Visits 1    Authorization Type Medicare A/Medicare B    SLP Start Time 1530    SLP Stop Time  1615    SLP Time Calculation (min) 45 min    Activity Tolerance Patient tolerated treatment well             Past Medical History:  Diagnosis Date   Cerebral palsy ([facility])    CP (cerebral palsy), spastic, quadriplegic ([facility])    History of chickenpox    Past Surgical History:  Procedure Laterality Date   Dorsal Rhinoplasty  1998   ESOPHAGOGASTRODUODENOSCOPY (EGD) WITH PROPOFOL N/A 04/17/2018   Procedure: ESOPHAGOGASTRODUODENOSCOPY (EGD) WITH PROPOFOL;  Surgeon: Toney Reil, MD;  Location: [facility] ENDOSCOPY;  Service: Gastroenterology;  Laterality: N/A;   Various muscle and Tendon Surgeries     1992-2000   Patient Active Problem List   Diagnosis Date Noted   Pressure injury of toe of left foot, stage 1 04/21/2022   Great toe pain, left 04/21/2022   Low HDL (under 40) 04/21/2022   Dysphagia 04/17/2018   Eosinophilic esophagitis    Cerebral palsy (HCC) 07/24/2016   Spastic quadriplegic cerebral palsy (HCC) 07/24/2016   Impaired mobility 07/24/2016   Spastic quadriplegia (HCC) 07/24/2016   Impaired vision 07/24/2016    ONSET DATE: 04/09/2023 date of referral; history of EoE/esophageal dysphagia  2019  REFERRING DIAG: R13.10 (ICD-10-CM) - Dysphagia, unspecified G80.0 (ICD-10-CM) - Spastic quadriplegic cerebral palsy  THERAPY DIAG: R13.10 (ICD-10-CM) - Dysphagia, unspecified G80.0 (ICD-10-CM) - Spastic quadriplegic cerebral palsy    Rationale for Evaluation and Treatment Rehabilitation  SUBJECTIVE:   SUBJECTIVE STATEMENT: Pt pleasant, talkative, known by this writer,   Pt accompanied by: family member; pt's father  PERTINENT HISTORY and DIAGNOSTIC FINDINGS: Pt is a 32 year old male with spastic quadriplegic CP (cerebral palsy) and neuromuscular scoliosis of thoracolumbar region. Pt with previous hospitalization (04/16/2018-04/17/2018) for choking on [employer] sandwich. During hospitalizaiton, pt diagnosed with Eosinophilic Esophagitis (EGD 04/17/2018 revealed Esophageal mucosal changes consistent with esoinophilic esophagitis. - pt placed on Prilosec 40 mg twice daily).   PAIN:  Are you having pain? No  FALLS: Has patient fallen in last 6 months?  No  LIVING ENVIRONMENT: Lives with: lives with their family Lives in: House/apartment  PLOF:  Level of assistance: Needed assistance with ADLs, Needed assistance with IADLS Employment: On disability   PATIENT GOALS  to decrease risk of aspiration  OBJECTIVE:  COGNITION: Overall cognitive status: History of cognitive impairments - at baseline  ORAL MOTOR EXAMINATION Facial : WFL Lingual: WFL Velum: WFL Mandible: WFL Cough: WFL Voice: WFL   CLINICAL SWALLOW ASSESSMENT:   Current diet: regular, Dysphagia 3 (mechanical soft), and thin liquids Dentition: adequate natural dentition Feeding: needs assist and needs set up   Evaluation findings: Patient presents with oropharyngeal swallow which appears clinically to be within functional limits with adequate airway protection. Oral stage is characterized by appearance of adequate oral containment, mastication, bolus formation, oral transfer and oral clearance. Swallow initiation appears timely. No overt signs of aspiration observed despite challenging with consecutive straw sips of thin liquids in excess of 3oz.  Aspiration risk factors:Neurological disease and Cognitive impairment Overall aspiration risk:Mild Diet  Recommendations: regular, Dysphagia 3 (mechanical soft), and thin liquids Precautions:Minimize environmental distractions, Slow rate,  Small sips/bites, Seated upright 90 degrees, and Remain upright for at least 30 minutes after meals Supervision: Caregiver to assist with self-feeding and Full supervision/cueing for compensatory strategies Oral care recommendations:Oral care BID Follow-up recommendations: No treatment recommended    PATIENT EDUCATION: Education details: aspiration precautions, diet recommendations, GI follow up  Person educated: Patient and Parent Education method: Chief Technology Officer Education comprehension: verbalized understanding   ASSESSMENT:  CLINICAL IMPRESSION: Patient is a 32 y.o. male who was seen today for a clinical swallow evaluation d/t intermittent cough with PO intake.  Pt's father accompanied pt and while pt frequently contributes to history, father is main historian. They provide that pt is supervised with meals and requires assistance with cutting up meats. His father describes intermittent cough with physical posturing by pt when consuming POs that gives the appearance of possible aspiration. Pt recovers immediately and he current doesn't have any recall of these events. Pt consumes liquids from his own cup when he didn't have with him today so thin liquids were not observed during this evaluation. This description could be related to multiple factorial events such as distractions while eating, talking while eating, untreated Gi issues, spastic CP, poor positioning when eating/drinking and less likely pharyngeal dysphagia as pt is free of any respiratory decline, his voice remains clear throughout PO consumption (per father's report). Extensive education provided on strict aspiration precautions during all PO consumption with recommendation for pt to follow up with GI. All questions answered to pt and his father's satisfaction.    PLAN: Will defer any therapy or further instrumental evaluation until follow up with GI.   Denese Mentink B. Dreama Saa, M.S., CCC-SLP, Research officer, trade union Certified Brain Injury Specialist [facility]  [facility] Rehabilitation Services Office [phone] [place]com [phone] Fax [phone]

## 2023-04-24 ENCOUNTER — Ambulatory Visit: Payer: Medicare Other | Admitting: Speech Pathology

## 2023-05-01 ENCOUNTER — Ambulatory Visit: Payer: Medicare Other | Admitting: Speech Pathology

## 2023-05-03 ENCOUNTER — Ambulatory Visit: Payer: Medicare Other | Admitting: Speech Pathology

## 2023-05-03 DIAGNOSIS — R1314 Dysphagia, pharyngoesophageal phase: Secondary | ICD-10-CM | POA: Diagnosis not present

## 2023-05-03 DIAGNOSIS — K219 Gastro-esophageal reflux disease without esophagitis: Secondary | ICD-10-CM | POA: Diagnosis not present

## 2023-05-03 DIAGNOSIS — K200 Eosinophilic esophagitis: Secondary | ICD-10-CM | POA: Diagnosis not present

## 2023-05-03 DIAGNOSIS — R0683 Snoring: Secondary | ICD-10-CM | POA: Diagnosis not present

## 2023-05-03 DIAGNOSIS — E0590 Thyrotoxicosis, unspecified without thyrotoxic crisis or storm: Secondary | ICD-10-CM | POA: Diagnosis not present

## 2023-05-08 ENCOUNTER — Ambulatory Visit: Payer: Medicare Other | Admitting: Speech Pathology

## 2023-05-10 ENCOUNTER — Ambulatory Visit: Payer: Medicare Other | Admitting: Speech Pathology

## 2023-05-16 ENCOUNTER — Encounter: Payer: Medicare Other | Admitting: Speech Pathology

## 2023-06-15 DIAGNOSIS — K219 Gastro-esophageal reflux disease without esophagitis: Secondary | ICD-10-CM | POA: Diagnosis not present

## 2023-06-15 DIAGNOSIS — K200 Eosinophilic esophagitis: Secondary | ICD-10-CM | POA: Diagnosis not present

## 2023-06-25 ENCOUNTER — Other Ambulatory Visit: Payer: Self-pay

## 2023-06-25 DIAGNOSIS — N200 Calculus of kidney: Secondary | ICD-10-CM

## 2023-06-28 ENCOUNTER — Ambulatory Visit
Admission: RE | Admit: 2023-06-28 | Discharge: 2023-06-28 | Disposition: A | Discharge status: Home / Self Care | Payer: Medicare Other | Source: Ambulatory Visit | Attending: Physician Assistant | Admitting: Physician Assistant

## 2023-06-28 ENCOUNTER — Encounter: Payer: Self-pay | Admitting: Physician Assistant

## 2023-06-28 ENCOUNTER — Ambulatory Visit: Payer: Medicare Other | Admitting: Physician Assistant

## 2023-06-28 VITALS — BP 120/80 | HR 74 | Ht 70.0 in | Wt 165.0 lb

## 2023-06-28 DIAGNOSIS — N200 Calculus of kidney: Secondary | ICD-10-CM | POA: Insufficient documentation

## 2023-06-28 DIAGNOSIS — Z09 Encounter for follow-up examination after completed treatment for conditions other than malignant neoplasm: Secondary | ICD-10-CM | POA: Diagnosis not present

## 2023-06-28 DIAGNOSIS — Z87442 Personal history of urinary calculi: Secondary | ICD-10-CM

## 2023-06-28 DIAGNOSIS — I878 Other specified disorders of veins: Secondary | ICD-10-CM | POA: Diagnosis not present

## 2023-06-28 NOTE — Progress Notes (Signed)
06/28/2023 5:31 PM   Gregory Villanueva March 20, 1991 [id number]  CC: Chief Complaint  Patient presents with   Follow-up   Nephrolithiasis   HPI: Gregory Villanueva is a 32 y.o. male with PMH spastic CP who underwent ESWL with Dr. Lonna Cobb last year for management of a 7 mm distal right ureteral stone who presents today for annual stone follow-up.  He is accompanied today by his parents, who contribute to HPI.  Today he reports doing well with no significant health changes in the past year.  He has had no episodes of dysuria, gross hematuria, or flank pain.  He has been staying well-hydrated and cutting back on dark soda.  He primarily drinks water, but will sometimes drink tea.  KUB today with no radiopaque urolithiasis.  PMH: Past Medical History:  Diagnosis Date   Cerebral palsy (HCC)    CP (cerebral palsy), spastic, quadriplegic (HCC)    History of chickenpox     Surgical History: Past Surgical History:  Procedure Laterality Date   Dorsal Rhinoplasty  1998   ESOPHAGOGASTRODUODENOSCOPY (EGD) WITH PROPOFOL N/A 04/17/2018   Procedure: ESOPHAGOGASTRODUODENOSCOPY (EGD) WITH PROPOFOL;  Surgeon: Toney Reil, MD;  Location: [facility] ENDOSCOPY;  Service: Gastroenterology;  Laterality: N/A;   Various muscle and Tendon Surgeries     1992-2000    Home Medications:  Allergies as of 06/28/2023   No Known Allergies      Medication List        Accurate as of June 28, 2023  5:31 PM. If you have any questions, ask your nurse or doctor.          acetaminophen 500 MG tablet Commonly known as: TYLENOL Take by mouth.   bisacodyl 10 MG suppository Commonly known as: DULCOLAX Place rectally.   naproxen 500 MG tablet Commonly known as: Naprosyn Take 1 tablet (500 mg total) by mouth 2 (two) times daily with a meal.   ondansetron 4 MG disintegrating tablet Commonly known as: ZOFRAN-ODT Take 1 tablet (4 mg total) by mouth every 8 (eight) hours as needed for nausea or  vomiting.        Allergies:  No Known Allergies  Family History: Family History  Problem Relation Age of Onset   Pancreatic cancer Father 53   Hypertension Maternal Grandmother    Hypertension Maternal Grandfather    Prostate cancer Neg Hx    Colon cancer Neg Hx     Social History:   reports that he has never smoked. He has never been exposed to tobacco smoke. He has never used smokeless tobacco. He reports that he does not drink alcohol and does not use drugs.  Physical Exam: BP 120/80   Pulse 74   Ht 5\' 10"  (1.778 m)   Wt 165 lb (74.8 kg)   BMI 23.68 kg/m   Constitutional:  Alert and oriented, no acute distress, nontoxic appearing HEENT: [place], AT Cardiovascular: No clubbing, cyanosis, or edema Respiratory: Normal respiratory effort, no increased work of breathing Skin: No rashes, bruises or suspicious lesions Neurologic: In wheelchair, spastic CP Psychiatric: Normal mood and affect  Pertinent Imaging: KUB, 06/28/2023: See epic  I personally reviewed the images referenced above and note no radiopaque urolithiasis.  Assessment & Plan:   1. Kidney stones He has been asymptomatic for the past year and no new stone seen on KUB today.  He prefers to keep annual follow-up for at least another year, which is reasonable.  I encouraged him to continue p.o. hydration with water, lemonade,  or other citric acid containing beverages. - Abdomen 1 view (KUB); Future  Return in about 1 year (around 06/27/2024) for Annual stone visit with KUB prior.  Carman Ching, [name]  [facility] [place] [address], [address] [place], [place] [address] [phone]

## 2023-09-21 DIAGNOSIS — K219 Gastro-esophageal reflux disease without esophagitis: Secondary | ICD-10-CM | POA: Diagnosis not present

## 2023-09-21 DIAGNOSIS — R1310 Dysphagia, unspecified: Secondary | ICD-10-CM | POA: Diagnosis not present

## 2023-09-21 DIAGNOSIS — K200 Eosinophilic esophagitis: Secondary | ICD-10-CM | POA: Diagnosis not present

## 2024-01-28 DIAGNOSIS — G800 Spastic quadriplegic cerebral palsy: Secondary | ICD-10-CM | POA: Diagnosis not present

## 2024-01-28 DIAGNOSIS — M4145 Neuromuscular scoliosis, thoracolumbar region: Secondary | ICD-10-CM | POA: Diagnosis not present

## 2024-01-28 DIAGNOSIS — G8929 Other chronic pain: Secondary | ICD-10-CM | POA: Diagnosis not present

## 2024-01-28 DIAGNOSIS — M2011 Hallux valgus (acquired), right foot: Secondary | ICD-10-CM | POA: Diagnosis not present

## 2024-01-28 DIAGNOSIS — M545 Low back pain, unspecified: Secondary | ICD-10-CM | POA: Diagnosis not present

## 2024-01-28 DIAGNOSIS — M2012 Hallux valgus (acquired), left foot: Secondary | ICD-10-CM | POA: Diagnosis not present

## 2024-02-28 DIAGNOSIS — M4185 Other forms of scoliosis, thoracolumbar region: Secondary | ICD-10-CM | POA: Diagnosis not present

## 2024-02-28 DIAGNOSIS — M419 Scoliosis, unspecified: Secondary | ICD-10-CM | POA: Diagnosis not present

## 2024-02-28 DIAGNOSIS — M4125 Other idiopathic scoliosis, thoracolumbar region: Secondary | ICD-10-CM | POA: Diagnosis not present

## 2024-05-12 DIAGNOSIS — G4730 Sleep apnea, unspecified: Secondary | ICD-10-CM | POA: Diagnosis not present

## 2024-05-17 DIAGNOSIS — G4733 Obstructive sleep apnea (adult) (pediatric): Secondary | ICD-10-CM | POA: Diagnosis not present

## 2024-05-27 ENCOUNTER — Ambulatory Visit: Payer: Self-pay

## 2024-05-27 DIAGNOSIS — R0981 Nasal congestion: Secondary | ICD-10-CM | POA: Diagnosis not present

## 2024-05-27 DIAGNOSIS — J189 Pneumonia, unspecified organism: Secondary | ICD-10-CM | POA: Diagnosis not present

## 2024-05-27 DIAGNOSIS — R051 Acute cough: Secondary | ICD-10-CM | POA: Diagnosis not present

## 2024-05-27 NOTE — Telephone Encounter (Signed)
 FYI Only or Action Required?: FYI only for provider.  Patient was last seen in primary care on 04/21/2022 by Cyndi Shaver, [name].  Called Nurse Triage reporting Fever.  Symptoms began today.  Interventions attempted: OTC medications: Tylenol .  Symptoms are: gradually improving.  Triage Disposition: See PCP When Office is Open (Within 3 Days)  Patient/caregiver understands and will follow disposition?: Yes Reason for Disposition  [1] Fever comes and goes (intermittent) AND [2] lasts > 3 weeks  Answer Assessment - Initial Assessment Questions Patient's mom calling on behalf of patient, patient stated he doesn't feel bad, besides the congestion. Temp on phone down to 100.   1. TEMPERATURE: What is the most recent temperature?  How was it measured?      104.4 when he woke up, down to 101 after taking Tylenol   2. ONSET: When did the fever start?      This morning   3. CHILLS: Do you have chills? If yes: How bad are they?  (e.g., none, mild, moderate, severe)     Denies  4. OTHER SYMPTOMS: Do you have any other symptoms besides the fever?  (e.g., abdomen pain, cough, diarrhea, earache, headache, sore throat, urination pain)     Nasal congestion for a few days, post nasal drip cough, throat a little scratchy.  5. CAUSE: If there are no symptoms, ask: What do you think is causing the fever?      Unsure  6. TREATMENT: What have you done so far to treat this fever? (e.g., OTC fever medicines)     Tylenol    7. IMMUNOCOMPROMISE: Do you have any of the following: diabetes, HIV positive, splenectomy, cancer chemotherapy, chronic steroid treatment, transplant patient, etc.?     Denies  Protocols used: [facility]  Copied from CRM [id number]. Topic: Clinical - Pink Word Triage >> May 27, 2024  9:04 AM Darshell M wrote: [place] Word triggered transfer to Nurse Triage. See Triage Message for details.Reason for Triage: Patient's mother calling in. Patient woke up this morning  with fever of 104. Down to 101 now after tylenol . CB# [phone]

## 2024-05-28 ENCOUNTER — Ambulatory Visit: Admitting: Family Medicine

## 2024-06-25 ENCOUNTER — Ambulatory Visit: Payer: Self-pay | Admitting: Physician Assistant

## 2024-07-21 ENCOUNTER — Ambulatory Visit
Admission: RE | Admit: 2024-07-21 | Discharge: 2024-07-21 | Disposition: A | Source: Ambulatory Visit | Attending: Physician Assistant | Admitting: Physician Assistant

## 2024-07-21 ENCOUNTER — Other Ambulatory Visit: Payer: Self-pay | Admitting: Physician Assistant

## 2024-07-21 DIAGNOSIS — I878 Other specified disorders of veins: Secondary | ICD-10-CM | POA: Diagnosis not present

## 2024-07-21 DIAGNOSIS — N200 Calculus of kidney: Secondary | ICD-10-CM | POA: Diagnosis present

## 2024-07-21 DIAGNOSIS — R109 Unspecified abdominal pain: Secondary | ICD-10-CM | POA: Diagnosis not present

## 2024-07-25 ENCOUNTER — Ambulatory Visit (INDEPENDENT_AMBULATORY_CARE_PROVIDER_SITE_OTHER): Admitting: Physician Assistant

## 2024-07-25 ENCOUNTER — Other Ambulatory Visit: Payer: Self-pay

## 2024-07-25 VITALS — BP 140/86 | HR 100 | Ht 70.0 in | Wt 165.0 lb

## 2024-07-25 DIAGNOSIS — N200 Calculus of kidney: Secondary | ICD-10-CM

## 2024-07-25 NOTE — Progress Notes (Signed)
 07/25/2024 10:41 AM   Gregory Villanueva [birth year] [id number]  CC: Chief Complaint  Patient presents with   Follow-up   Nephrolithiasis   HPI: Gregory Villanueva is a 33 y.o. male with PMH spastic CP and nephrolithiasis who presents today for annual follow-up.  He is accompanied today by his parents, who contribute to HPI.  Today he reports no flank pain, hematuria, or stones passed in the past year.  They have no acute concerns today.  He attributes his lack of recurrent stone episodes to dietary changes and limiting teas and dark sodas.  KUB today with no radiopaque urolithiasis.  PMH: Past Medical History:  Diagnosis Date   Cerebral palsy (HCC)    CP (cerebral palsy), spastic, quadriplegic (HCC)    History of chickenpox     Surgical History: Past Surgical History:  Procedure Laterality Date   Dorsal Rhinoplasty  1998   ESOPHAGOGASTRODUODENOSCOPY (EGD) WITH PROPOFOL  N/A 04/17/2018   Procedure: ESOPHAGOGASTRODUODENOSCOPY (EGD) WITH PROPOFOL ;  Surgeon: Unk Corinn Skiff, MD;  Location: [facility] ENDOSCOPY;  Service: Gastroenterology;  Laterality: N/A;   Various muscle and Tendon Surgeries     1992-2000    Home Medications:  Allergies as of 07/25/2024   No Known Allergies      Medication List        Accurate as of July 25, 2024 10:41 AM. If you have any questions, ask your nurse or doctor.          STOP taking these medications    acetaminophen  500 MG tablet Commonly known as: TYLENOL    naproxen  500 MG tablet Commonly known as: Naprosyn    ondansetron  4 MG disintegrating tablet Commonly known as: ZOFRAN -ODT       TAKE these medications    bisacodyl  10 MG suppository Commonly known as: DULCOLAX Place rectally.       Allergies:  Allergies[1]  Family History: Family History  Problem Relation Age of Onset   Pancreatic cancer Father 37   Hypertension Maternal Grandmother    Hypertension Maternal Grandfather    Prostate cancer Neg Hx    Colon  cancer Neg Hx     Social History:   reports that he has never smoked. He has never been exposed to tobacco smoke. He has never used smokeless tobacco. He reports that he does not drink alcohol and does not use drugs.  Physical Exam: BP (!) 140/86 (BP Location: Left Arm, Patient Position: Sitting, Cuff Size: Normal)   Pulse 100   Ht 5' 10 (1.778 m)   Wt 165 lb (74.8 kg)   SpO2 100%   BMI 23.68 kg/m   Constitutional:  Alert and oriented, no acute distress, nontoxic appearing HEENT: [place], AT Cardiovascular: No clubbing, cyanosis, or edema Respiratory: Normal respiratory effort, no increased work of breathing Skin: No rashes, bruises or suspicious lesions Neurologic: In wheelchair Psychiatric: Normal mood and affect  Pertinent Imaging: KUB, 07/21/2024: See epic  I personally reviewed the images referenced above and note no radiopaque urolithiasis.  Assessment & Plan:   1. Kidney stones (Primary) Asymptomatic, no evidence of recurrence on KUB today.  Using shared decision making, we elected to continue with annual KUB for another year.  If still clear at that point, may discontinue monitoring and have him follow-up as needed.  Return in about 1 year (around 07/25/2025) for Annual stone visit with KUB prior.  Lucie Hones, [name]  [facility] [place] [address] [place], [place] [address] [phone]     [  1] No Known Allergies

## 2024-11-04 ENCOUNTER — Ambulatory Visit

## 2025-07-24 ENCOUNTER — Ambulatory Visit: Admitting: Physician Assistant
